# Patient Record
Sex: Female | Born: 1980 | Race: White | Hispanic: No | Marital: Married | Smoking: Never smoker
Health system: Southern US, Community
[De-identification: ages and names within clinical notes are randomized; demographics above are authoritative.]

## PROBLEM LIST (undated history)

## (undated) DIAGNOSIS — K859 Acute pancreatitis without necrosis or infection, unspecified: Secondary | ICD-10-CM

## (undated) DIAGNOSIS — R109 Unspecified abdominal pain: Secondary | ICD-10-CM

## (undated) DIAGNOSIS — E119 Type 2 diabetes mellitus without complications: Secondary | ICD-10-CM

## (undated) DIAGNOSIS — E1110 Type 2 diabetes mellitus with ketoacidosis without coma: Secondary | ICD-10-CM

## (undated) HISTORY — PX: WISDOM TOOTH EXTRACTION: SHX21

---

## 1999-06-26 ENCOUNTER — Emergency Department (HOSPITAL_COMMUNITY): Admission: EM | Admit: 1999-06-26 | Discharge: 1999-06-26 | Payer: Self-pay | Admitting: Emergency Medicine

## 1999-06-26 ENCOUNTER — Encounter: Payer: Self-pay | Admitting: Emergency Medicine

## 1999-12-24 ENCOUNTER — Other Ambulatory Visit: Admission: RE | Admit: 1999-12-24 | Discharge: 1999-12-24 | Payer: Self-pay | Admitting: Obstetrics and Gynecology

## 2000-04-18 ENCOUNTER — Encounter: Payer: Self-pay | Admitting: *Deleted

## 2000-04-18 ENCOUNTER — Ambulatory Visit (HOSPITAL_COMMUNITY): Admission: RE | Admit: 2000-04-18 | Discharge: 2000-04-18 | Payer: Self-pay | Admitting: *Deleted

## 2001-03-19 ENCOUNTER — Other Ambulatory Visit: Admission: RE | Admit: 2001-03-19 | Discharge: 2001-03-19 | Payer: Self-pay | Admitting: Obstetrics and Gynecology

## 2001-03-31 ENCOUNTER — Encounter: Admission: RE | Admit: 2001-03-31 | Discharge: 2001-06-29 | Payer: Self-pay | Admitting: Family Medicine

## 2003-01-05 ENCOUNTER — Other Ambulatory Visit: Admission: RE | Admit: 2003-01-05 | Discharge: 2003-01-05 | Payer: Self-pay | Admitting: Obstetrics and Gynecology

## 2004-01-24 ENCOUNTER — Other Ambulatory Visit: Admission: RE | Admit: 2004-01-24 | Discharge: 2004-01-24 | Payer: Self-pay | Admitting: Obstetrics and Gynecology

## 2005-01-24 ENCOUNTER — Other Ambulatory Visit: Admission: RE | Admit: 2005-01-24 | Discharge: 2005-01-24 | Payer: Self-pay | Admitting: Obstetrics and Gynecology

## 2005-06-30 ENCOUNTER — Emergency Department (HOSPITAL_COMMUNITY): Admission: EM | Admit: 2005-06-30 | Discharge: 2005-06-30 | Payer: Self-pay | Admitting: Emergency Medicine

## 2006-03-16 ENCOUNTER — Emergency Department (HOSPITAL_COMMUNITY): Admission: EM | Admit: 2006-03-16 | Discharge: 2006-03-16 | Payer: Self-pay | Admitting: Emergency Medicine

## 2006-08-13 ENCOUNTER — Ambulatory Visit (HOSPITAL_COMMUNITY): Admission: RE | Admit: 2006-08-13 | Discharge: 2006-08-13 | Payer: Self-pay | Admitting: Obstetrics and Gynecology

## 2007-03-20 ENCOUNTER — Encounter: Admission: RE | Admit: 2007-03-20 | Discharge: 2007-03-20 | Payer: Self-pay | Admitting: Obstetrics and Gynecology

## 2008-02-02 ENCOUNTER — Ambulatory Visit (HOSPITAL_COMMUNITY): Admission: AD | Admit: 2008-02-02 | Discharge: 2008-02-03 | Payer: Self-pay | Admitting: Gynecology

## 2008-02-03 ENCOUNTER — Encounter: Payer: Self-pay | Admitting: Gynecology

## 2008-02-03 ENCOUNTER — Ambulatory Visit (HOSPITAL_COMMUNITY): Admission: RE | Admit: 2008-02-03 | Discharge: 2008-02-03 | Payer: Self-pay | Admitting: Gynecology

## 2008-04-12 ENCOUNTER — Other Ambulatory Visit: Admission: RE | Admit: 2008-04-12 | Discharge: 2008-04-12 | Payer: Self-pay | Admitting: Gynecology

## 2008-06-02 ENCOUNTER — Ambulatory Visit: Payer: Self-pay | Admitting: Gynecology

## 2008-07-01 ENCOUNTER — Ambulatory Visit: Payer: Self-pay | Admitting: Gynecology

## 2008-07-10 ENCOUNTER — Ambulatory Visit: Payer: Self-pay | Admitting: Gynecology

## 2008-07-12 ENCOUNTER — Ambulatory Visit: Payer: Self-pay | Admitting: Gynecology

## 2008-07-29 ENCOUNTER — Ambulatory Visit: Payer: Self-pay | Admitting: Gynecology

## 2008-08-01 ENCOUNTER — Ambulatory Visit: Payer: Self-pay | Admitting: Gynecology

## 2008-08-09 ENCOUNTER — Ambulatory Visit: Payer: Self-pay | Admitting: Gynecology

## 2008-08-12 ENCOUNTER — Ambulatory Visit: Payer: Self-pay | Admitting: Gynecology

## 2008-08-29 ENCOUNTER — Ambulatory Visit: Payer: Self-pay | Admitting: Gynecology

## 2008-11-15 ENCOUNTER — Ambulatory Visit: Payer: Self-pay | Admitting: Gynecology

## 2008-12-06 ENCOUNTER — Ambulatory Visit: Payer: Self-pay | Admitting: Gynecology

## 2008-12-16 ENCOUNTER — Ambulatory Visit: Payer: Self-pay | Admitting: Gynecology

## 2008-12-19 ENCOUNTER — Ambulatory Visit: Payer: Self-pay | Admitting: Gynecology

## 2009-04-17 ENCOUNTER — Encounter: Payer: Self-pay | Admitting: Gynecology

## 2009-04-17 ENCOUNTER — Other Ambulatory Visit: Admission: RE | Admit: 2009-04-17 | Discharge: 2009-04-17 | Payer: Self-pay | Admitting: Gynecology

## 2009-04-17 ENCOUNTER — Ambulatory Visit: Payer: Self-pay | Admitting: Gynecology

## 2009-10-17 ENCOUNTER — Ambulatory Visit (HOSPITAL_COMMUNITY): Admission: RE | Admit: 2009-10-17 | Discharge: 2009-10-17 | Payer: Self-pay | Admitting: Obstetrics and Gynecology

## 2009-12-05 ENCOUNTER — Ambulatory Visit (HOSPITAL_COMMUNITY): Admission: RE | Admit: 2009-12-05 | Discharge: 2009-12-05 | Payer: Self-pay | Admitting: Obstetrics and Gynecology

## 2009-12-07 ENCOUNTER — Encounter: Admission: RE | Admit: 2009-12-07 | Discharge: 2009-12-07 | Payer: Self-pay | Admitting: Obstetrics and Gynecology

## 2010-01-02 ENCOUNTER — Ambulatory Visit (HOSPITAL_COMMUNITY): Admission: RE | Admit: 2010-01-02 | Discharge: 2010-01-02 | Payer: Self-pay | Admitting: Obstetrics and Gynecology

## 2010-01-23 ENCOUNTER — Ambulatory Visit (HOSPITAL_COMMUNITY): Admission: RE | Admit: 2010-01-23 | Discharge: 2010-01-23 | Payer: Self-pay | Admitting: Obstetrics and Gynecology

## 2010-01-29 ENCOUNTER — Ambulatory Visit (HOSPITAL_COMMUNITY): Admission: RE | Admit: 2010-01-29 | Discharge: 2010-01-29 | Payer: Self-pay | Admitting: Obstetrics and Gynecology

## 2010-02-05 ENCOUNTER — Ambulatory Visit (HOSPITAL_COMMUNITY): Admission: RE | Admit: 2010-02-05 | Discharge: 2010-02-05 | Payer: Self-pay | Admitting: Obstetrics and Gynecology

## 2010-02-12 ENCOUNTER — Ambulatory Visit (HOSPITAL_COMMUNITY): Admission: RE | Admit: 2010-02-12 | Discharge: 2010-02-12 | Payer: Self-pay | Admitting: Obstetrics and Gynecology

## 2010-02-21 ENCOUNTER — Ambulatory Visit (HOSPITAL_COMMUNITY): Admission: RE | Admit: 2010-02-21 | Discharge: 2010-02-21 | Payer: Self-pay | Admitting: Obstetrics and Gynecology

## 2010-02-28 ENCOUNTER — Ambulatory Visit (HOSPITAL_COMMUNITY): Admission: RE | Admit: 2010-02-28 | Discharge: 2010-02-28 | Payer: Self-pay | Admitting: Obstetrics and Gynecology

## 2010-03-07 ENCOUNTER — Ambulatory Visit (HOSPITAL_COMMUNITY): Admission: RE | Admit: 2010-03-07 | Discharge: 2010-03-07 | Payer: Self-pay | Admitting: Obstetrics and Gynecology

## 2010-03-08 ENCOUNTER — Encounter: Admission: RE | Admit: 2010-03-08 | Discharge: 2010-03-08 | Payer: Self-pay | Admitting: Obstetrics and Gynecology

## 2010-03-14 ENCOUNTER — Ambulatory Visit (HOSPITAL_COMMUNITY): Admission: RE | Admit: 2010-03-14 | Discharge: 2010-03-14 | Payer: Self-pay | Admitting: Obstetrics and Gynecology

## 2010-03-21 ENCOUNTER — Ambulatory Visit (HOSPITAL_COMMUNITY): Admission: RE | Admit: 2010-03-21 | Discharge: 2010-03-21 | Payer: Self-pay | Admitting: Obstetrics and Gynecology

## 2010-03-27 ENCOUNTER — Ambulatory Visit (HOSPITAL_COMMUNITY): Admission: RE | Admit: 2010-03-27 | Discharge: 2010-03-27 | Payer: Self-pay | Admitting: Obstetrics and Gynecology

## 2010-03-30 ENCOUNTER — Ambulatory Visit (HOSPITAL_COMMUNITY): Admission: RE | Admit: 2010-03-30 | Discharge: 2010-03-30 | Payer: Self-pay | Admitting: Obstetrics and Gynecology

## 2010-04-03 ENCOUNTER — Ambulatory Visit (HOSPITAL_COMMUNITY): Admission: RE | Admit: 2010-04-03 | Discharge: 2010-04-03 | Payer: Self-pay | Admitting: Obstetrics and Gynecology

## 2010-04-06 ENCOUNTER — Ambulatory Visit (HOSPITAL_COMMUNITY): Admission: RE | Admit: 2010-04-06 | Discharge: 2010-04-06 | Payer: Self-pay | Admitting: Obstetrics and Gynecology

## 2010-04-10 ENCOUNTER — Ambulatory Visit (HOSPITAL_COMMUNITY): Admission: RE | Admit: 2010-04-10 | Discharge: 2010-04-10 | Payer: Self-pay | Admitting: Obstetrics and Gynecology

## 2010-04-13 ENCOUNTER — Ambulatory Visit (HOSPITAL_COMMUNITY): Admission: RE | Admit: 2010-04-13 | Discharge: 2010-04-13 | Payer: Self-pay | Admitting: Obstetrics and Gynecology

## 2010-04-17 ENCOUNTER — Ambulatory Visit (HOSPITAL_COMMUNITY): Admission: RE | Admit: 2010-04-17 | Discharge: 2010-04-17 | Payer: Self-pay | Admitting: Obstetrics and Gynecology

## 2010-04-20 ENCOUNTER — Inpatient Hospital Stay (HOSPITAL_COMMUNITY): Admission: RE | Admit: 2010-04-20 | Discharge: 2010-04-23 | Payer: Self-pay | Admitting: Obstetrics and Gynecology

## 2010-04-20 ENCOUNTER — Encounter (INDEPENDENT_AMBULATORY_CARE_PROVIDER_SITE_OTHER): Payer: Self-pay | Admitting: Obstetrics and Gynecology

## 2010-09-19 IMAGING — US US OB LIMITED
1 series · 14 of 17 positions shown · non-contrast
Comparison: none

OBSTETRICAL ULTRASOUND:
 This ultrasound was performed in The [HOSPITAL], and the [facility] report will be stored to [REDACTED] PACS.  This report is also available in [HOSPITAL]?s accessANYware.

[Series 1: us ob limited · 14 of 17 slices shown]
[im 1/17]
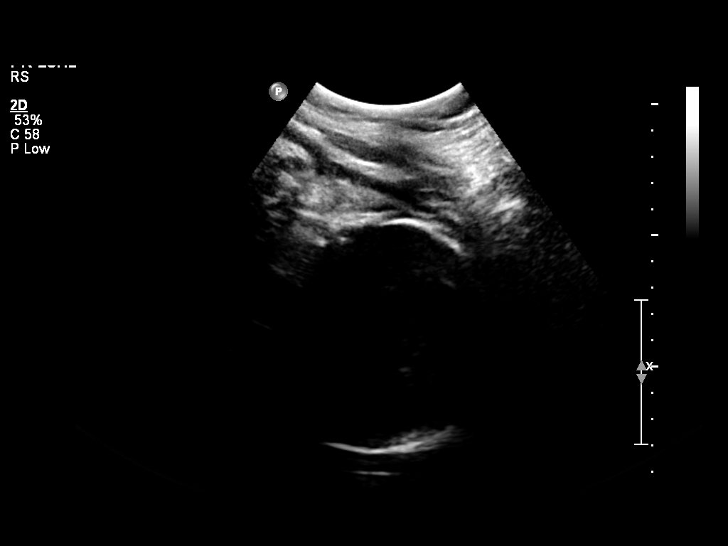
[im 2/17]
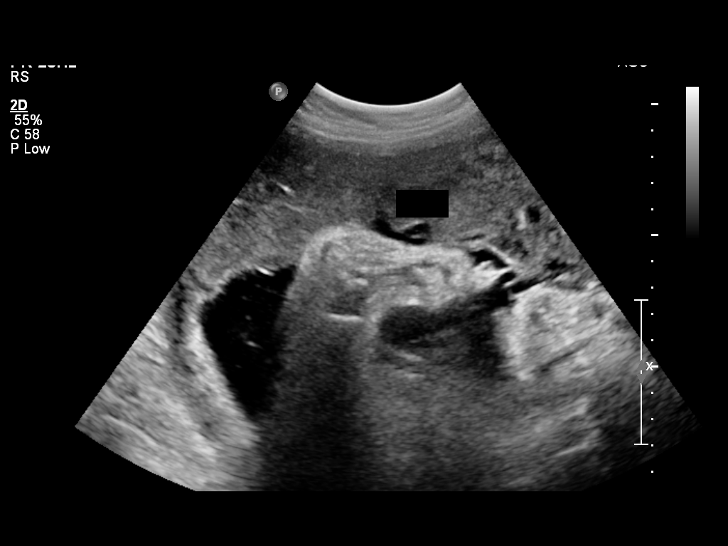
[im 4/17]
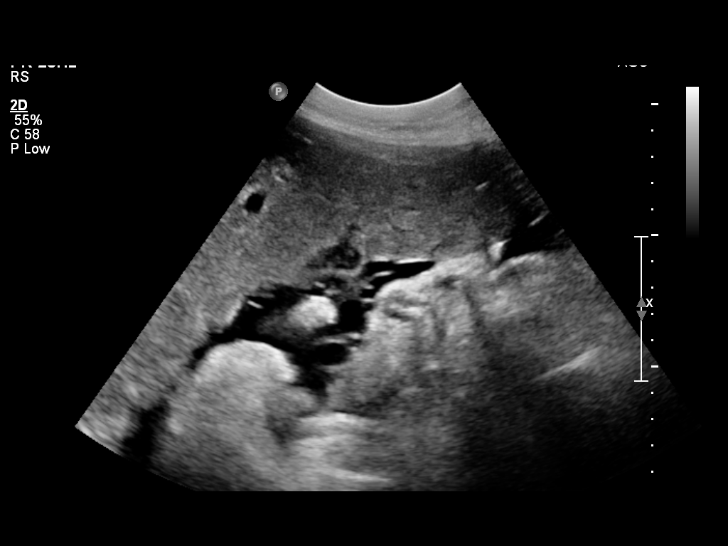
[im 5/17]
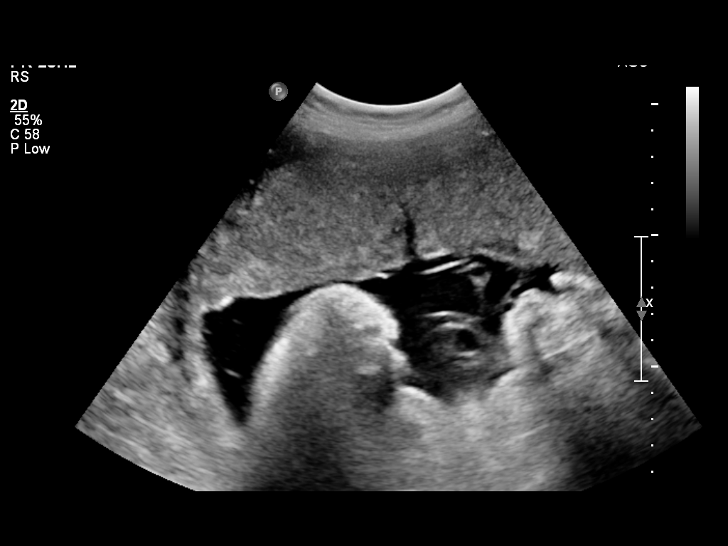
[im 6/17]
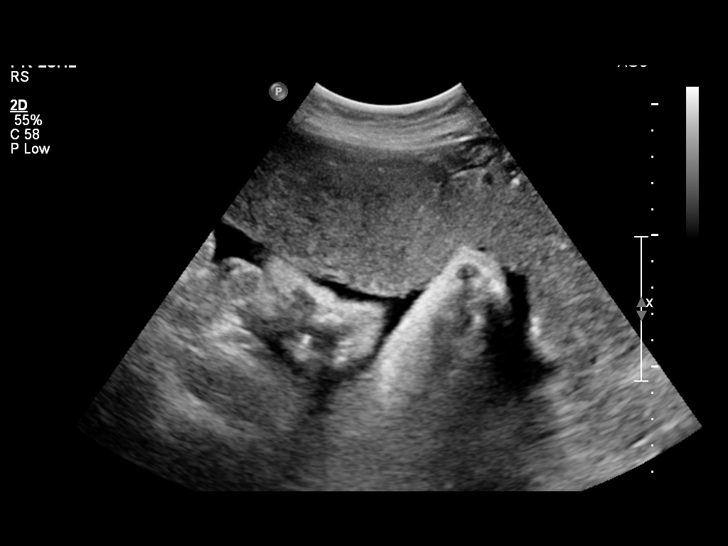
[im 7/17]
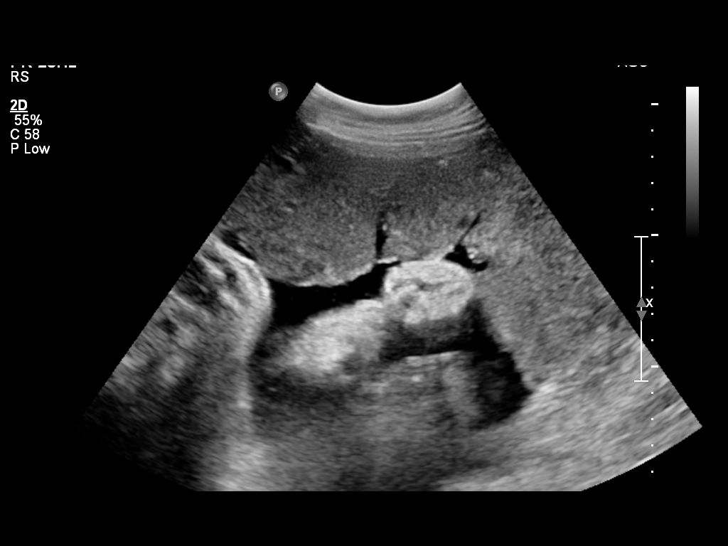
[im 8/17]
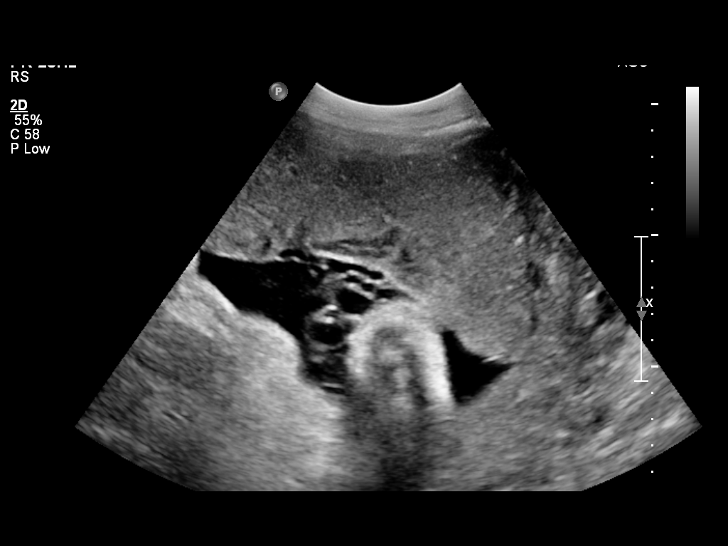
[im 10/17]
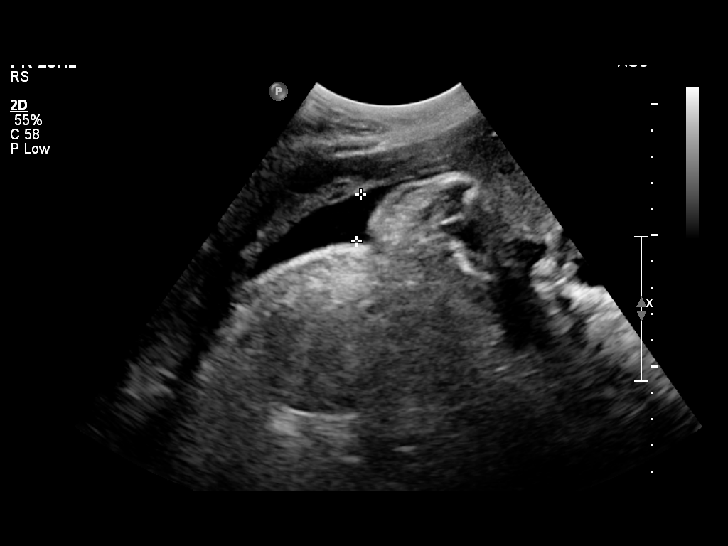
[im 11/17]
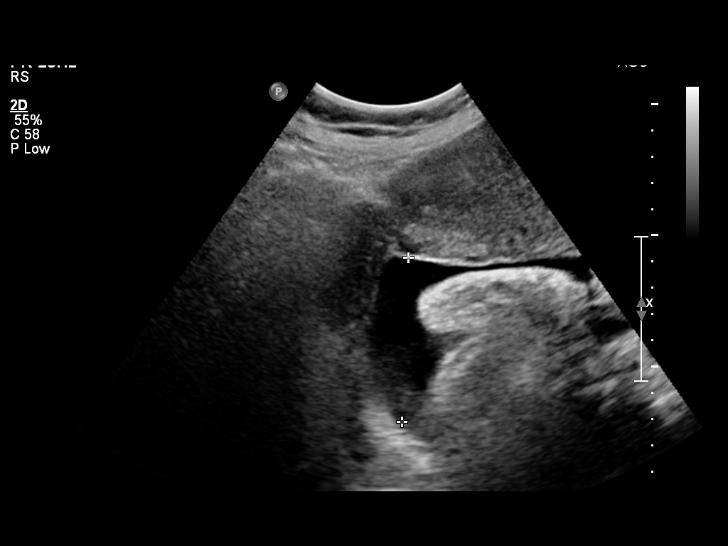
[im 12/17]
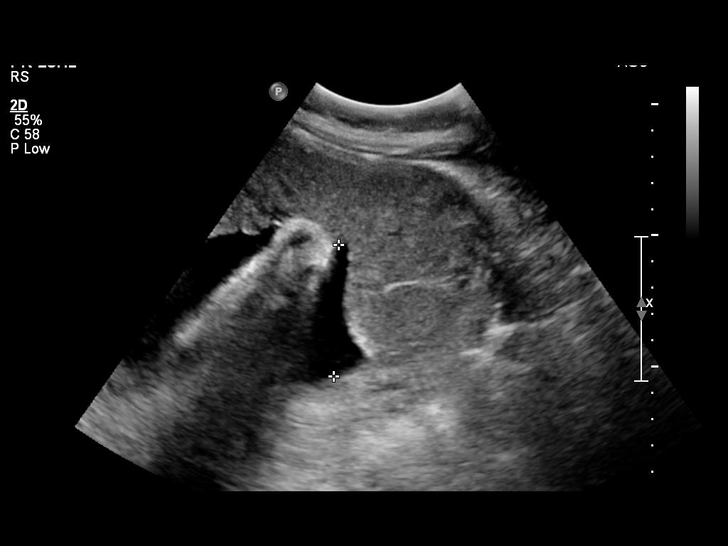
[im 13/17]
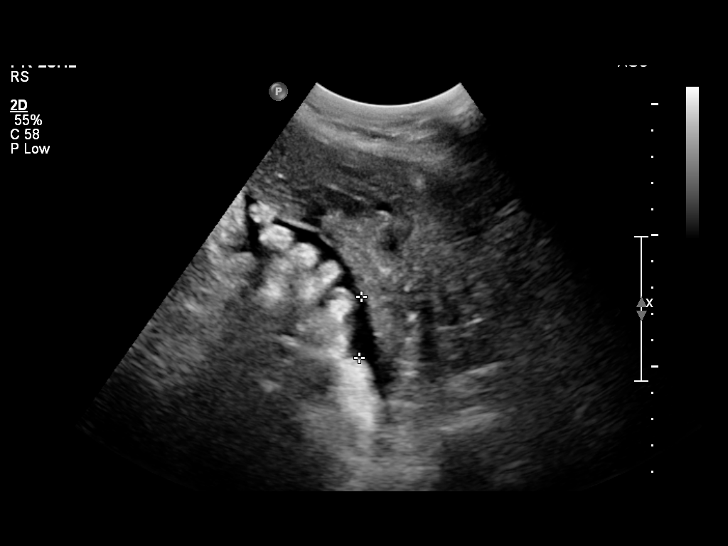
[im 14/17]
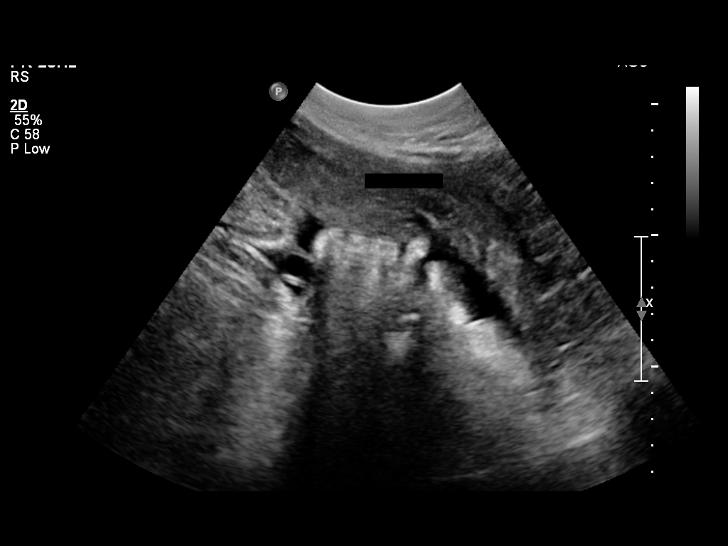
[im 16/17]
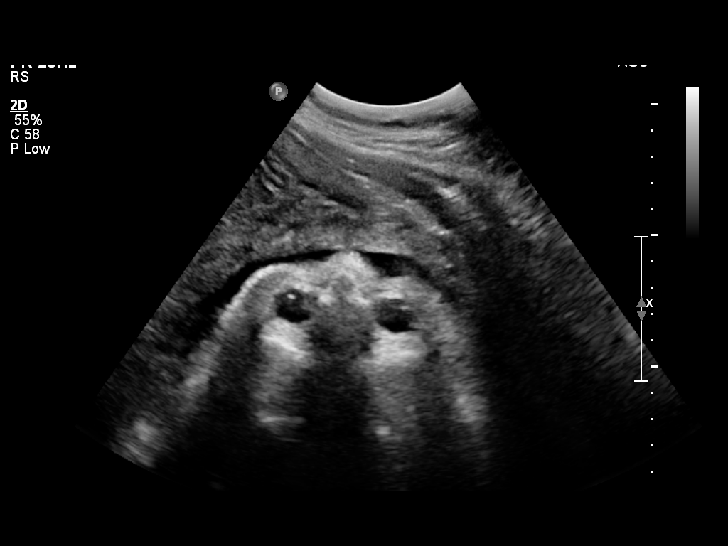
[im 17/17]
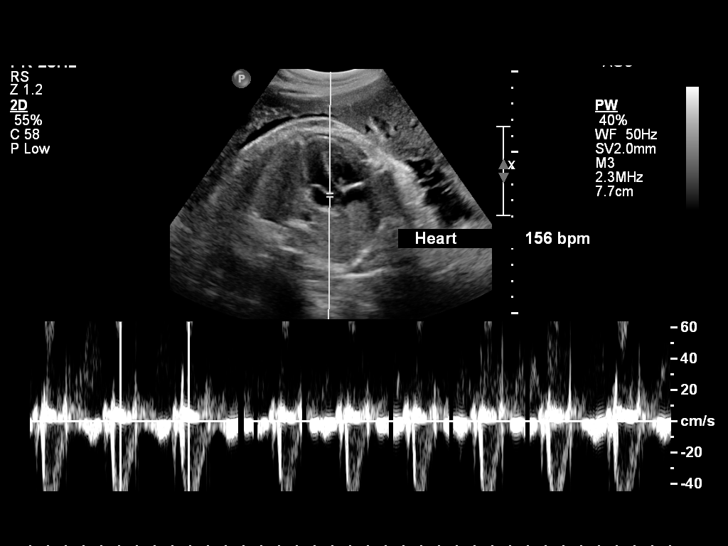

[14 of 17 positions shown; findings below may reference images not displayed]

IMPRESSION: [facility] has also been faxed to the ordering physician.

## 2010-10-14 ENCOUNTER — Encounter: Payer: Self-pay | Admitting: Obstetrics and Gynecology

## 2010-12-07 LAB — GLUCOSE, CAPILLARY: Glucose-Capillary: 115 mg/dL — ABNORMAL HIGH (ref 70–99)

## 2010-12-08 LAB — GLUCOSE, CAPILLARY
Glucose-Capillary: 106 mg/dL — ABNORMAL HIGH (ref 70–99)
Glucose-Capillary: 191 mg/dL — ABNORMAL HIGH (ref 70–99)
Glucose-Capillary: 64 mg/dL — ABNORMAL LOW (ref 70–99)
Glucose-Capillary: 72 mg/dL (ref 70–99)
Glucose-Capillary: 76 mg/dL (ref 70–99)
Glucose-Capillary: 84 mg/dL (ref 70–99)
Glucose-Capillary: 90 mg/dL (ref 70–99)
Glucose-Capillary: 97 mg/dL (ref 70–99)
Glucose-Capillary: 98 mg/dL (ref 70–99)

## 2010-12-08 LAB — CBC
HCT: 30 % — ABNORMAL LOW (ref 36.0–46.0)
Hemoglobin: 10 g/dL — ABNORMAL LOW (ref 12.0–15.0)
MCH: 25.9 pg — ABNORMAL LOW (ref 26.0–34.0)
MCH: 26.1 pg (ref 26.0–34.0)
MCV: 78.4 fL (ref 78.0–100.0)
RBC: 3.83 MIL/uL — ABNORMAL LOW (ref 3.87–5.11)
RDW: 17.9 % — ABNORMAL HIGH (ref 11.5–15.5)
RDW: 18.9 % — ABNORMAL HIGH (ref 11.5–15.5)
WBC: 7.8 10*3/uL (ref 4.0–10.5)
WBC: 7.9 10*3/uL (ref 4.0–10.5)

## 2010-12-08 LAB — COMPREHENSIVE METABOLIC PANEL
ALT: 17 U/L (ref 0–35)
Albumin: 2.5 g/dL — ABNORMAL LOW (ref 3.5–5.2)
BUN: 9 mg/dL (ref 6–23)
Calcium: 9.2 mg/dL (ref 8.4–10.5)
Creatinine, Ser: 0.76 mg/dL (ref 0.4–1.2)
GFR calc non Af Amer: >60 mL/min (ref >60)
Total Bilirubin: 0.4 mg/dL (ref 0.3–1.2)
Total Protein: 6.2 g/dL (ref 6.0–8.3)

## 2010-12-08 LAB — RPR: RPR Ser Ql: NONREACTIVE

## 2011-02-05 NOTE — Op Note (Signed)
Laurie Rubio, Laurie Rubio NO.:  [id number]   MEDICAL RECORD NO.:  [id number]          PATIENT TYPE:  MAT   LOCATION:  [facility]                          FACILITY:  [facility]   PHYSICIAN:  Juan H. Lily Peer, M.D.DATE OF BIRTH:  May 27, 1981   DATE OF PROCEDURE:  02/03/2008  DATE OF DISCHARGE:  02/03/2008                               OPERATIVE REPORT   INDICATIONS FOR OPERATION:  A 30 year old gravida 2, now AB 2,  patient  with type 2 diabetes with evidence of first trimester incomplete AB at  9.[redacted] weeks gestation.   PREOPERATIVE DIAGNOSES:  1. Type 2 diabetes on insulin.  2. First trimester incomplete abortion.  3. Recurrent pregnancy losses.   POSTOPERATIVE DIAGNOSES:  1. Type 2 diabetes on insulin.  2. First trimester incomplete abortion.  3. Recurrent pregnancy losses.   ANESTHESIA:  MAC along with a paracervical block consisting of 2%  lidocaine with 1:100000 epinephrine.   PROCEDURE PERFORMED:  Dilatation, evacuation.   FINDINGS:  8 to 10 weeks size uterus with tissue present at the cervical  os and blood and blood clots were present.   DESCRIPTION OF OPERATION:  After the patient was adequately counseled,  she was taken to the operating room she underwent successful intravenous  sedation.  She received 1 g of cefoxitin and was placed in low lithotomy  position.  Her vagina and perineum were prepped and draped in usual  sterile fashion.  Red rubber Roxan Hockey had been inserted into the bladder  to evacuate its contents for approximately 50 mL.  A speculum was  inserted into the vagina.  Tissue was found to be protruding from the  external os which was removed with the ring forceps.  An aliquot was  submitted for chromosomal studies and the rest of tissue along with  evacuation of the uterine contents was submitted for histological  evaluation.  The uterus had sounded to approximately 8.5 cm and required  minimal cervical dilatation with a Pratt dilator.  A 10-French  suction  curette was introduced into the intrauterine cavity to evacuate its  contents.  This was interchanged with a Hunter's curette to completely  evacuate the uterus of its contents.  Prior to this, 2% lidocaine with  1:100,000 epinephrine was infiltrated into the cervical stroma at the 2,  4, 8, and 10 o'clock position for a total 10 mL.  The patient tolerated  procedure well.  Blood loss was less than 50 mL and both fluid  resuscitation consisted of 100 mL of lactated Ringer's.  Preoperatively,  her blood sugar was 128 and will be checking in the recovery room as  well and blood type is A+.     Juan H. Lily Peer, M.D.  Electronically Signed    JHF/MEDQ  D:  02/03/2008  T:  02/03/2008  Job:  [id number]

## 2013-06-30 ENCOUNTER — Other Ambulatory Visit: Payer: Self-pay | Admitting: Endocrinology

## 2013-06-30 DIAGNOSIS — E049 Nontoxic goiter, unspecified: Secondary | ICD-10-CM

## 2013-07-05 ENCOUNTER — Ambulatory Visit
Admission: RE | Admit: 2013-07-05 | Discharge: 2013-07-05 | Disposition: A | Discharge status: Home / Self Care | Payer: Managed Care, Other (non HMO) | Source: Ambulatory Visit | Attending: Endocrinology | Admitting: Endocrinology

## 2013-07-05 DIAGNOSIS — E049 Nontoxic goiter, unspecified: Secondary | ICD-10-CM

## 2013-07-08 ENCOUNTER — Other Ambulatory Visit: Payer: Self-pay | Admitting: Endocrinology

## 2013-07-08 DIAGNOSIS — E041 Nontoxic single thyroid nodule: Secondary | ICD-10-CM

## 2013-07-08 DIAGNOSIS — E042 Nontoxic multinodular goiter: Secondary | ICD-10-CM

## 2013-07-13 ENCOUNTER — Inpatient Hospital Stay: Admission: RE | Admit: 2013-07-13 | Payer: Managed Care, Other (non HMO) | Source: Ambulatory Visit

## 2013-07-13 ENCOUNTER — Inpatient Hospital Stay
Admission: RE | Admit: 2013-07-13 | Discharge: 2013-07-13 | Disposition: A | Discharge status: Home / Self Care | Payer: Managed Care, Other (non HMO) | Source: Ambulatory Visit | Attending: Endocrinology | Admitting: Endocrinology

## 2013-07-19 ENCOUNTER — Other Ambulatory Visit: Payer: Self-pay | Admitting: Radiology

## 2013-07-20 ENCOUNTER — Ambulatory Visit
Admission: RE | Admit: 2013-07-20 | Discharge: 2013-07-20 | Disposition: A | Discharge status: Home / Self Care | Payer: Managed Care, Other (non HMO) | Source: Ambulatory Visit | Attending: Endocrinology | Admitting: Endocrinology

## 2013-07-20 ENCOUNTER — Other Ambulatory Visit (HOSPITAL_COMMUNITY)
Admission: RE | Admit: 2013-07-20 | Discharge: 2013-07-20 | Disposition: A | Discharge status: Home / Self Care | Payer: Managed Care, Other (non HMO) | Source: Ambulatory Visit | Attending: Interventional Radiology | Admitting: Interventional Radiology

## 2013-07-20 DIAGNOSIS — E042 Nontoxic multinodular goiter: Secondary | ICD-10-CM

## 2013-07-20 DIAGNOSIS — E041 Nontoxic single thyroid nodule: Secondary | ICD-10-CM

## 2014-08-14 ENCOUNTER — Emergency Department (INDEPENDENT_AMBULATORY_CARE_PROVIDER_SITE_OTHER)
Admission: EM | Admit: 2014-08-14 | Discharge: 2014-08-14 | Disposition: A | Discharge status: Home / Self Care | Payer: Medicaid Other | Source: Home / Self Care | Attending: Family Medicine | Admitting: Family Medicine

## 2014-08-14 ENCOUNTER — Encounter (HOSPITAL_COMMUNITY): Payer: Self-pay | Admitting: Emergency Medicine

## 2014-08-14 DIAGNOSIS — R499 Unspecified voice and resonance disorder: Secondary | ICD-10-CM

## 2014-08-14 DIAGNOSIS — H02849 Edema of unspecified eye, unspecified eyelid: Secondary | ICD-10-CM

## 2014-08-14 DIAGNOSIS — H109 Unspecified conjunctivitis: Secondary | ICD-10-CM

## 2014-08-14 HISTORY — DX: Type 2 diabetes mellitus without complications: E11.9

## 2014-08-14 MED ORDER — PREDNISONE 20 MG PO TABS: 40.0000 mg | ORAL_TABLET | Freq: Every day | ORAL | Status: DC

## 2014-08-14 MED ORDER — PREDNISONE 20 MG PO TABS
ORAL_TABLET | ORAL | Status: AC
  Filled 2014-08-14: qty 3

## 2014-08-14 MED ORDER — POLYMYXIN B-TRIMETHOPRIM 10000-0.1 UNIT/ML-% OP SOLN: 2.0000 [drp] | OPHTHALMIC | Status: DC

## 2014-08-14 MED ORDER — PREDNISONE 20 MG PO TABS
60.0000 mg | ORAL_TABLET | Freq: Once | ORAL | Status: AC
  Administered 2014-08-14: 60 mg via ORAL

## 2014-08-14 NOTE — ED Notes (Signed)
C/o  Irritation, pain, and crusting of both eyes.  also states feels like I'm having mild swelling in my throat.  Son recently dx with pink eye.

## 2014-08-14 NOTE — Discharge Instructions (Signed)
Thank you for coming in today. Go right to the emergency room if you get worse.  Take prednisone  Check your blood sugar.    Bacterial Conjunctivitis Bacterial conjunctivitis, commonly called pink eye, is an inflammation of the clear membrane that covers the white part of the eye (conjunctiva). The inflammation can also happen on the underside of the eyelids. The blood vessels in the conjunctiva become inflamed, causing the eye to become red or pink. Bacterial conjunctivitis may spread easily from one eye to another and from person to person (contagious).  CAUSES  Bacterial conjunctivitis is caused by bacteria. The bacteria may come from your own skin, your upper respiratory tract, or from someone else with bacterial conjunctivitis. SYMPTOMS  The normally white color of the eye or the underside of the eyelid is usually pink or red. The pink eye is usually associated with irritation, tearing, and some sensitivity to light. Bacterial conjunctivitis is often associated with a thick, yellowish discharge from the eye. The discharge may turn into a crust on the eyelids overnight, which causes your eyelids to stick together. If a discharge is present, there may also be some blurred vision in the affected eye. DIAGNOSIS  Bacterial conjunctivitis is diagnosed by your caregiver through an eye exam and the symptoms that you report. Your caregiver looks for changes in the surface tissues of your eyes, which may point to the specific type of conjunctivitis. A sample of any discharge may be collected on a cotton-tip swab if you have a severe case of conjunctivitis, if your cornea is affected, or if you keep getting repeat infections that do not respond to treatment. The sample will be sent to a lab to see if the inflammation is caused by a bacterial infection and to see if the infection will respond to antibiotic medicines. TREATMENT   Bacterial conjunctivitis is treated with antibiotics. Antibiotic eyedrops are  most often used. However, antibiotic ointments are also available. Antibiotics pills are sometimes used. Artificial tears or eye washes may ease discomfort. HOME CARE INSTRUCTIONS   To ease discomfort, apply a cool, clean washcloth to your eye for 10-20 minutes, 3-4 times a day.  Gently wipe away any drainage from your eye with a warm, wet washcloth or a cotton ball.  Wash your hands often with soap and water. Use paper towels to dry your hands.  Do not share towels or washcloths. This may spread the infection.  Change or wash your pillowcase every day.  You should not use eye makeup until the infection is gone.  Do not operate machinery or drive if your vision is blurred.  Stop using contact lenses. Ask your caregiver how to sterilize or replace your contacts before using them again. This depends on the type of contact lenses that you use.  When applying medicine to the infected eye, do not touch the edge of your eyelid with the eyedrop bottle or ointment tube. SEEK IMMEDIATE MEDICAL CARE IF:   Your infection has not improved within 3 days after beginning treatment.  You had yellow discharge from your eye and it returns.  You have increased eye pain.  Your eye redness is spreading.  Your vision becomes blurred.  You have a fever or persistent symptoms for more than 2-3 days.  You have a fever and your symptoms suddenly get worse.  You have facial pain, redness, or swelling. MAKE SURE YOU:   Understand these instructions.  Will watch your condition.  Will get help right away if you are  not doing well or get worse. Document Released: 09/09/2005 Document Revised: 01/24/2014 Document Reviewed: 02/10/2012 [facility] Patient Information 2015 [employer], [place]. This information is not intended to replace advice given to you by your health care provider. Make sure you discuss any questions you have with your health care provider.

## 2014-08-14 NOTE — ED Provider Notes (Signed)
Laurie Rubio is a 33 y.o. female who presents to Urgent Care today for eye swelling and pain with voice change. Symptoms started yesterday. Child with conjunctivae is present in the house. Patient notes bilateral conjunctivitis and irritation associated with eyelid swelling and facial rash. She denies any new soaps detergents shampoos cosmetics etc. No new medications. She has not tried anything. Her eyes were matted shut this morning. Additionally she notes a voice change. She denies any trouble swallowing or breathing. She feels well otherwise.   Past Medical History  Diagnosis Date  . Diabetes mellitus without complication    History reviewed. No pertinent past surgical history. History  Substance Use Topics  . Smoking status: Never Smoker   . Smokeless tobacco: Not on file  . Alcohol Use: No   ROS as above Medications: No current facility-administered medications for this encounter.   Current Outpatient Prescriptions  Medication Sig Dispense Refill  . insulin glargine (LANTUS) 100 UNIT/ML injection Inject into the skin at bedtime.    . Insulin Lispro, Human, (HUMALOG [place]) Inject into the skin.    . metFORMIN (GLUCOPHAGE) 500 MG tablet Take by mouth 2 (two) times daily with a meal.    . predniSONE (DELTASONE) 20 MG tablet Take 2 tablets (40 mg total) by mouth daily. 10 tablet 0  . trimethoprim-polymyxin b (POLYTRIM) ophthalmic solution Place 2 drops into both eyes every 4 (four) hours. 10 mL 1   No Known Allergies   Exam:  BP 125/75 mmHg  Pulse 112  Temp(Src) 99.7 F (37.6 C) (Oral)  Resp 16  SpO2 96%  LMP 08/14/2014 Gen: Well NAD HEENT: EOMI,  MMM bilateral conjunctival injection with eyelid swelling and mild erythematous milar macular rash. Eyes are nontender. Pain-free range of motion of the eyes. PERRL. normal-appearing posterior pharynx. No tongue swelling. Lungs: Normal work of breathing. CTABL Heart: Mildly tachycardic but regular no MRG Abd: NABS, Soft.  Nondistended, Nontender Exts: Brisk capillary refill, warm and well perfused.   No results found for this or any previous visit (from the past 24 hour(s)). No results found.  Assessment and Plan: 33 y.o. female with bilateral conjunctivitis. Patient additionally has voice change. Plan to treat conjunctivitis with Polytrim eyedrops. Additionally will use prednisone for possible pharynx inflammation. She appears well. Discussed return precautions. Patient voices understanding and agreement. Recommend patient check her blood sugar carefully while taking prednisone with diabetes.  Discussed warning signs or symptoms. Please see discharge instructions. Patient expresses understanding.     Rodolph BongEvan S Prabhnoor Ellenberger, MD 08/14/14 [id number]

## 2017-09-23 DIAGNOSIS — E1110 Type 2 diabetes mellitus with ketoacidosis without coma: Secondary | ICD-10-CM

## 2017-09-23 DIAGNOSIS — R109 Unspecified abdominal pain: Secondary | ICD-10-CM

## 2017-09-23 HISTORY — DX: Type 2 diabetes mellitus with ketoacidosis without coma: E11.10

## 2017-09-23 HISTORY — DX: Unspecified abdominal pain: R10.9

## 2017-10-06 ENCOUNTER — Encounter (HOSPITAL_COMMUNITY): Payer: Self-pay

## 2017-10-06 ENCOUNTER — Other Ambulatory Visit: Payer: Self-pay

## 2017-10-06 ENCOUNTER — Inpatient Hospital Stay (HOSPITAL_COMMUNITY)
Admission: EM | Admit: 2017-10-06 | Discharge: 2017-10-08 | DRG: 639 | Disposition: A | Discharge status: Home / Self Care | Payer: Managed Care, Other (non HMO) | Attending: Family Medicine | Admitting: Family Medicine

## 2017-10-06 ENCOUNTER — Emergency Department (HOSPITAL_COMMUNITY): Payer: Managed Care, Other (non HMO)

## 2017-10-06 DIAGNOSIS — E1110 Type 2 diabetes mellitus with ketoacidosis without coma: Principal | ICD-10-CM

## 2017-10-06 DIAGNOSIS — Z794 Long term (current) use of insulin: Secondary | ICD-10-CM

## 2017-10-06 DIAGNOSIS — R1013 Epigastric pain: Secondary | ICD-10-CM | POA: Diagnosis present

## 2017-10-06 DIAGNOSIS — M545 Low back pain: Secondary | ICD-10-CM | POA: Diagnosis present

## 2017-10-06 DIAGNOSIS — Z23 Encounter for immunization: Secondary | ICD-10-CM

## 2017-10-06 DIAGNOSIS — Z7984 Long term (current) use of oral hypoglycemic drugs: Secondary | ICD-10-CM

## 2017-10-06 DIAGNOSIS — E1165 Type 2 diabetes mellitus with hyperglycemia: Secondary | ICD-10-CM

## 2017-10-06 DIAGNOSIS — IMO0002 Reserved for concepts with insufficient information to code with codable children: Secondary | ICD-10-CM | POA: Diagnosis present

## 2017-10-06 DIAGNOSIS — E118 Type 2 diabetes mellitus with unspecified complications: Secondary | ICD-10-CM | POA: Diagnosis present

## 2017-10-06 DIAGNOSIS — E1151 Type 2 diabetes mellitus with diabetic peripheral angiopathy without gangrene: Secondary | ICD-10-CM | POA: Diagnosis present

## 2017-10-06 DIAGNOSIS — R109 Unspecified abdominal pain: Secondary | ICD-10-CM | POA: Diagnosis present

## 2017-10-06 DIAGNOSIS — T383X6A Underdosing of insulin and oral hypoglycemic [antidiabetic] drugs, initial encounter: Secondary | ICD-10-CM | POA: Diagnosis present

## 2017-10-06 DIAGNOSIS — E1310 Other specified diabetes mellitus with ketoacidosis without coma: Secondary | ICD-10-CM | POA: Diagnosis not present

## 2017-10-06 DIAGNOSIS — R638 Other symptoms and signs concerning food and fluid intake: Secondary | ICD-10-CM | POA: Diagnosis present

## 2017-10-06 HISTORY — DX: Type 2 diabetes mellitus with ketoacidosis without coma: E11.10

## 2017-10-06 HISTORY — DX: Unspecified abdominal pain: R10.9

## 2017-10-06 LAB — URINALYSIS, ROUTINE W REFLEX MICROSCOPIC
BACTERIA UA: NONE SEEN
Bilirubin Urine: NEGATIVE
Glucose, UA: >=500 mg/dL — AB
KETONES UR: 80 mg/dL — AB
Leukocytes, UA: NEGATIVE
Nitrite: NEGATIVE
PROTEIN: 100 mg/dL — AB
Specific Gravity, Urine: >1.046 — ABNORMAL HIGH (ref 1.005–1.030)
pH: 5 (ref 5.0–8.0)

## 2017-10-06 LAB — COMPREHENSIVE METABOLIC PANEL
ALBUMIN: 3.5 g/dL (ref 3.5–5.0)
ALK PHOS: 69 U/L (ref 38–126)
ALT: 16 U/L (ref 14–54)
ANION GAP: 17 — AB (ref 5–15)
AST: 27 U/L (ref 15–41)
BUN: 14 mg/dL (ref 6–20)
CHLORIDE: 94 mmol/L — AB (ref 101–111)
CO2: 15 mmol/L — AB (ref 22–32)
CREATININE: 0.82 mg/dL (ref 0.44–1.00)
Calcium: 9 mg/dL (ref 8.9–10.3)
GFR calc Af Amer: >60 mL/min (ref >60)
GFR calc non Af Amer: >60 mL/min (ref >60)
Glucose, Bld: 359 mg/dL — ABNORMAL HIGH (ref 65–99)
Potassium: 3.9 mmol/L (ref 3.5–5.1)
Sodium: 126 mmol/L — ABNORMAL LOW (ref 135–145)
Total Bilirubin: 2.1 mg/dL — ABNORMAL HIGH (ref 0.3–1.2)
Total Protein: 7.7 g/dL (ref 6.5–8.1)

## 2017-10-06 LAB — CBC
HEMATOCRIT: 42.7 % (ref 36.0–46.0)
HEMOGLOBIN: 14.9 g/dL (ref 12.0–15.0)
MCH: 27.5 pg (ref 26.0–34.0)
MCHC: 34.9 g/dL (ref 30.0–36.0)
MCV: 78.9 fL (ref 78.0–100.0)
Platelets: 294 10*3/uL (ref 150–400)
RBC: 5.41 MIL/uL — ABNORMAL HIGH (ref 3.87–5.11)
RDW: 13.5 % (ref 11.5–15.5)
WBC: 16.8 10*3/uL — ABNORMAL HIGH (ref 4.0–10.5)

## 2017-10-06 LAB — I-STAT BETA HCG BLOOD, ED (MC, WL, AP ONLY): I-stat hCG, quantitative: <5 m[IU]/mL (ref <5)

## 2017-10-06 LAB — I-STAT VENOUS BLOOD GAS, ED
ACID-BASE DEFICIT: 10 mmol/L — AB (ref 0.0–2.0)
BICARBONATE: 14.8 mmol/L — AB (ref 20.0–28.0)
O2 SAT: 92 %
TCO2: 16 mmol/L — AB (ref 22–32)
pCO2, Ven: 29.7 mmHg — ABNORMAL LOW (ref 44.0–60.0)
pH, Ven: 7.307 (ref 7.250–7.430)
pO2, Ven: 69 mmHg — ABNORMAL HIGH (ref 32.0–45.0)

## 2017-10-06 LAB — LIPASE, BLOOD: Lipase: 63 U/L — ABNORMAL HIGH (ref 11–51)

## 2017-10-06 LAB — CBG MONITORING, ED: Glucose-Capillary: 361 mg/dL — ABNORMAL HIGH (ref 65–99)

## 2017-10-06 MED ORDER — MORPHINE SULFATE (PF) 4 MG/ML IV SOLN: 4.0000 mg | Freq: Once | INTRAVENOUS | Status: DC

## 2017-10-06 MED ORDER — SODIUM CHLORIDE 0.9 % IV BOLUS (SEPSIS)
1000.0000 mL | Freq: Once | INTRAVENOUS | Status: AC
  Administered 2017-10-06: 1000 mL via INTRAVENOUS

## 2017-10-06 MED ORDER — PIPERACILLIN-TAZOBACTAM 3.375 G IVPB 30 MIN
3.3750 g | Freq: Once | INTRAVENOUS | Status: AC
  Administered 2017-10-06: 3.375 g via INTRAVENOUS
  Filled 2017-10-06: qty 50

## 2017-10-06 MED ORDER — SODIUM CHLORIDE 0.9 % IV SOLN
INTRAVENOUS | Status: DC
  Administered 2017-10-06: 3 [IU]/h via INTRAVENOUS
  Filled 2017-10-06: qty 1

## 2017-10-06 MED ORDER — POTASSIUM CHLORIDE 10 MEQ/50ML IV SOLN: 10.0000 meq | INTRAVENOUS | Status: DC | PRN

## 2017-10-06 MED ORDER — DEXTROSE-NACL 5-0.45 % IV SOLN: INTRAVENOUS | Status: DC

## 2017-10-06 MED ORDER — SODIUM CHLORIDE 0.9 % IV SOLN
INTRAVENOUS | Status: DC
  Administered 2017-10-07: 01:00:00 via INTRAVENOUS

## 2017-10-06 MED ORDER — HYDROMORPHONE HCL 1 MG/ML IJ SOLN
1.0000 mg | Freq: Once | INTRAMUSCULAR | Status: AC
  Administered 2017-10-06: 1 mg via INTRAVENOUS
  Filled 2017-10-06: qty 1

## 2017-10-06 MED ORDER — IOPAMIDOL (ISOVUE-300) INJECTION 61%
INTRAVENOUS | Status: AC
  Administered 2017-10-06: 100 mL
  Filled 2017-10-06: qty 100

## 2017-10-06 NOTE — ED Provider Notes (Signed)
Patient placed in Quick Look pathway, seen and evaluated   Chief Complaint: Abdominal pain  HPI:   37 year old female presents today with complaints of right-sided abdominal pain.  Patient reports 2 days ago she started to develop right upper and lower quadrant abdominal pain that has persisted.  Patient notes nausea no vomiting, reports normal bowel movements, denies any fever at home.  Patient reports a C-section approximately 7 years ago otherwise no surgical history.  She notes that her urine has been frequent, but questions if this is secondary to uncontrolled diabetes.  Patient was seen by primary care and referred over to the emergency room, no previous labs ordered.  ROS: Positive for abdominal pain, positive for nausea, negative for vomiting, negative for fever, positive for urinary frequency (one)  Physical Exam:   Gen: No distress  Neuro: Awake and Alert  Skin: Warm    Focused Exam: Exquisite tenderness to both right upper and right lower abdomen, no guarding, no masses  Vitals:   10/06/17 1808  BP: 128/81  Pulse: (!) 127  Resp: 16  Temp: 99.4 F (37.4 C)  SpO2: 97%     Initiation of care has begun. The patient has been counseled on the process, plan, and necessity for staying for the completion/evaluation, and the remainder of the medical screening examination    Eyvonne MechanicHedges, Jaia Alonge, Cordelia Poche-C 10/06/17 1821    Jacalyn LefevreHaviland, Julie, MD 10/06/17 [phone]

## 2017-10-06 NOTE — ED Triage Notes (Signed)
Pt states pain on the right side of the abdomen X2 days. Some pain radiating to the back. Pt states she has felt nauseous but no vomiting. Skin warm and dry. No acute distress.

## 2017-10-06 NOTE — ED Provider Notes (Signed)
[facility] EMERGENCY DEPARTMENT Provider Note   CSN: [id number] Arrival date & time: 10/06/17  1729     History   Chief Complaint Chief Complaint  Patient presents with  . Abdominal Pain    HPI Laurie Rubio is a 37 y.o. female with a h/o of IDDM Type II who presents to the emergency department with a chief complaint of abdominal pain with associated nausea that began 3 days ago.   She reports constant epigastric pain when the pain began that moved to her right-side today. She states the pain is non-radiating, constant, severe, and characterized as pressure. She states the pain was significantly worse on the car ride to the ED with all of the bouncing from the ride. No other aggravating or alleviating factors.  She denies fever, chills, emesis, hematemesis, dysuria, frequency, hematuria, vaginal pain or discharge, diarrhea, constipation, melena, or hematochezia. LMP was earlier today. LMP 09/16/17.   She also complains of constant, mid-line low back pain that began 3 days ago.  No known trauma or injury.  No aggravating or alleviating factors.  No history of similar. She also endorses intermittent blurred vision over the last month and intermittent muscle spasms, "Charley horses" in her bilateral lower legs and feet over the last 3 days.  She reports that she was seen earlier today by her PCP who advised her to come to the emergency department for further workup.  She has been out of her home metformin and insulin for the last month.  She just reorder the medications yesterday.  The history is provided by the patient. No language interpreter was used.    Past Medical History:  Diagnosis Date  . Diabetes mellitus without complication [facility])     Patient Active Problem List   Diagnosis Date Noted  . DKA, type 2 (HCC) 10/07/2017  . Abdominal pain 10/07/2017  . Diabetic ketoacidosis without coma associated with type 2 diabetes mellitus (HCC)     History  reviewed. No pertinent surgical history.  OB History    No data available       Home Medications    Prior to Admission medications   Medication Sig Start Date End Date Taking? Authorizing Provider  metFORMIN (GLUCOPHAGE) 500 MG tablet Take 1,000 mg by mouth 2 (two) times daily with a meal.    Yes [provider]  predniSONE (DELTASONE) 20 MG tablet Take 2 tablets (40 mg total) by mouth daily. Patient not taking: Reported on 10/06/2017 08/14/14   Rodolph Bong, MD  trimethoprim-polymyxin b Joaquim Lai) ophthalmic solution Place 2 drops into both eyes every 4 (four) hours. Patient not taking: Reported on 10/06/2017 08/14/14   Rodolph Bong, MD    Family History Family History  Problem Relation Age of Onset  . Hypertension Other     Social History Social History   Tobacco Use  . Smoking status: Never Smoker  . Smokeless tobacco: Never Used  Substance Use Topics  . Alcohol use: No  . Drug use: Not on file     Allergies   Patient has no known allergies.   Review of Systems Review of Systems  Constitutional: Negative for chills and fever.  HENT: Negative for congestion.   Eyes: Positive for visual disturbance (blurred).  Respiratory: Negative for shortness of breath.   Cardiovascular: Negative for chest pain.  Gastrointestinal: Positive for abdominal pain and nausea. Negative for constipation, diarrhea and vomiting.  Genitourinary: Negative for dysuria, frequency, hematuria, vaginal discharge and vaginal pain.  Musculoskeletal: Positive for back pain. Negative for gait problem, neck pain and neck stiffness.  Skin: Negative for color change.  Allergic/Immunologic: Positive for immunocompromised state.  Neurological: Negative for dizziness, weakness and light-headedness.   Physical Exam Updated Vital Signs BP (!) 109/55   Pulse (!) 105   Temp 99.8 F (37.7 C) (Rectal)   Resp 16   Ht 5\' 6"  (1.676 m)   Wt 88.9 kg (196 lb)   LMP 09/16/2017 (Within Days)    SpO2 97%   BMI 31.64 kg/m   Physical Exam  Constitutional: She is oriented to person, place, and time. No distress.  Appears much older than stated age.  HENT:  Head: Normocephalic.  Eyes: Conjunctivae are normal.  Neck: Normal range of motion. Neck supple.  Cardiovascular: Normal rate, regular rhythm, normal heart sounds and intact distal pulses. Exam reveals no gallop and no friction rub.  No murmur heard. Pulmonary/Chest: Effort normal and breath sounds normal. No stridor. No respiratory distress. She has no wheezes. She has no rales. She exhibits no tenderness.  Abdominal: Soft. She exhibits no distension and no mass. There is generalized tenderness. There is guarding. There is no rigidity. No hernia.  She is exquisitely tender to palpation throughout the abdomen.  No rigidity.  Abdomen is soft, nondistended.  No CVA tenderness bilaterally.  Musculoskeletal: She exhibits tenderness. She exhibits no edema or deformity.  Tender to palpation over the spinous processes of the lumbar spine.  No overlying erythema, edema, warmth, rash, or ecchymosis.  No surrounding paraspinal muscle tenderness.  Cervical and thoracic spinous processes and bilateral paraspinal muscles are nontender to palpation  Neurological: She is alert and oriented to person, place, and time.  GCS 15.  Skin: Skin is warm. Capillary refill takes less than 2 seconds. No rash noted. She is not diaphoretic.  Psychiatric: Her behavior is normal.  Nursing note and vitals reviewed.  ED Treatments / Results  Labs (all labs ordered are listed, but only abnormal results are displayed) Labs Reviewed  LIPASE, BLOOD - Abnormal; Notable for the following components:      Result Value   Lipase 63 (*)    All other components within normal limits  COMPREHENSIVE METABOLIC PANEL - Abnormal; Notable for the following components:   Sodium 126 (*)    Chloride 94 (*)    CO2 15 (*)    Glucose, Bld 359 (*)    Total Bilirubin 2.1 (*)     Anion gap 17 (*)    All other components within normal limits  CBC - Abnormal; Notable for the following components:   WBC 16.8 (*)    RBC 5.41 (*)    All other components within normal limits  URINALYSIS, ROUTINE W REFLEX MICROSCOPIC - Abnormal; Notable for the following components:   Specific Gravity, Urine >1.046 (*)    Glucose, UA >=500 (*)    Hgb urine dipstick MODERATE (*)    Ketones, ur 80 (*)    Protein, ur 100 (*)    Squamous Epithelial / LPF 0-5 (*)    All other components within normal limits  HEPATIC FUNCTION PANEL - Abnormal; Notable for the following components:   Albumin 3.4 (*)    Total Bilirubin 2.0 (*)    Indirect Bilirubin 1.6 (*)    All other components within normal limits  I-STAT VENOUS BLOOD GAS, ED - Abnormal; Notable for the following components:   pCO2, Ven 29.7 (*)    pO2, Ven 69.0 (*)    Bicarbonate  14.8 (*)    TCO2 16 (*)    Acid-base deficit 10.0 (*)    All other components within normal limits  CBG MONITORING, ED - Abnormal; Notable for the following components:   Glucose-Capillary 361 (*)    All other components within normal limits  CBG MONITORING, ED - Abnormal; Notable for the following components:   Glucose-Capillary 281 (*)    All other components within normal limits  CBG MONITORING, ED - Abnormal; Notable for the following components:   Glucose-Capillary 292 (*)    All other components within normal limits  CBG MONITORING, ED - Abnormal; Notable for the following components:   Glucose-Capillary 246 (*)    All other components within normal limits  CULTURE, BLOOD (ROUTINE X 2)  CULTURE, BLOOD (ROUTINE X 2)  HIV ANTIBODY (ROUTINE TESTING)  BASIC METABOLIC PANEL  BASIC METABOLIC PANEL  BASIC METABOLIC PANEL  HEMOGLOBIN A1C  MAGNESIUM  RAPID URINE DRUG SCREEN, HOSP PERFORMED  HEPATIC FUNCTION PANEL  TROPONIN I  LACTIC ACID, PLASMA  I-STAT BETA HCG BLOOD, ED ([facility], [facility], [facility] ONLY)    EKG  EKG Interpretation None        Radiology Ct Abdomen Pelvis W Contrast  Result Date: 10/06/2017 CLINICAL DATA:  Nausea right-sided abdominal pain EXAM: CT ABDOMEN AND PELVIS WITH CONTRAST TECHNIQUE: Multidetector CT imaging of the abdomen and pelvis was performed using the standard protocol following bolus administration of intravenous contrast. CONTRAST:  ISOVUE-300 IOPAMIDOL (ISOVUE-300) INJECTION 61% COMPARISON:  None. FINDINGS: Lower chest: Lung bases demonstrate no acute consolidation or effusion. Normal heart size. Hepatobiliary: Hepatic steatosis. No calcified gallstones or biliary dilatation Pancreas: Fluid and edema inferior to the pancreatic head and uncinate process. No ductal dilatation. Homogeneous pancreas enhancement Spleen: Normal in size without focal abnormality. Adrenals/Urinary Tract: Adrenal glands are unremarkable. Kidneys are normal, without renal calculi, focal lesion, or hydronephrosis. Bladder is unremarkable. Stomach/Bowel: Stomach is nonenlarged. Focal wall thickening involving the third portion of duodenum. No extraluminal gas. No evidence for bowel obstruction. Normal appendix. No colon wall thickening Vascular/Lymphatic: Aortic atherosclerosis. No enlarged abdominal or pelvic lymph nodes. Reproductive: Uterus unremarkable. Probable arcuate configuration of the uterus. Small probable cysts within both ovaries. Other: Negative for free air. Small amount of free fluid in the right anterior pararenal space. Musculoskeletal: No acute or significant osseous findings. IMPRESSION: 1. Moderate edema and fluid inferior to the pancreatic head and uncinate process and adjacent to the third portion of duodenum. Findings could be secondary to acute pancreatitis versus duodenitis. Third portion of duodenum is also thickened. There is no extraluminal gas in this region to suggest perforation. 2. Hepatic steatosis Electronically Signed   By: Jasmine Pang M.D.   On: 10/06/2017 22:08    Procedures Procedures  (including critical care time)  Medications Ordered in ED Medications  piperacillin-tazobactam (ZOSYN) IVPB 3.375 g (not administered)  0.9 %  sodium chloride infusion (not administered)  dextrose 5 %-0.45 % sodium chloride infusion (not administered)  insulin regular (NOVOLIN R,HUMULIN R) 100 Units in sodium chloride 0.9 % 100 mL (1 Units/mL) infusion (not administered)  HYDROmorphone (DILAUDID) injection 0.5 mg (not administered)  sodium chloride 0.9 % bolus 1,000 mL (0 mLs Intravenous Stopped 10/06/17 2313)  iopamidol (ISOVUE-300) 61 % injection (100 mLs  Contrast Given 10/06/17 2139)  sodium chloride 0.9 % bolus 1,000 mL (0 mLs Intravenous Stopped 10/07/17 0048)    And  sodium chloride 0.9 % bolus 1,000 mL (0 mLs Intravenous Stopped 10/07/17 0048)  HYDROmorphone (DILAUDID) injection 1  mg (1 mg Intravenous Given 10/06/17 2327)  piperacillin-tazobactam (ZOSYN) IVPB 3.375 g (0 g Intravenous Stopped 10/07/17 0015)     Initial Impression / Assessment and Plan / ED Course  I have reviewed the triage vital signs and the nursing notes.  Pertinent labs & imaging results that were available during my care of the patient were reviewed by me and considered in my medical decision making (see chart for details).     37 year old female with a h/o of IDDM Type II who has not taken her home medications for the last months. She presents with severe abdominal pain, nausea, low back pain, and blurred vision. Tachycardic in the 110s. Rectal temp. 99.8. Normotensive with no tachypnea.   Hyperglycemic to 361; anion gap 17; bicarb 15. Started on [employer]abil[employer] order set.  Corrected NA 131. PH 7.307 on VBG. Acid-base deficit 10. GCS 15. Normal neurological exam.  The patient was discussed with Dr. Clarice PolePfeifer, attending physician.  WBC 16.8. T. Bili 2.1, indirect bili 1.6. On physical exam,  pain was out of proportion to exam throughout the entire abdomen.  No rigidity to suggest peritonitis. CT with moderate edema  and fluid inferior to the pancreatic head and uncinate process and adjacent to the third portion of the duodenum concerning for acute pancreatitis versus duodenitis.  Third portion of the duodenum is also thickened.  No evidence of perforation. Lipase 63, minimally elevated, this could possibly be a early pancreatitis her lipase could be elevated secondary to DKA. but Zosyn was initiated in the ED given leukocytosis, CT findings, and physical exam.   Dr. Toniann FailKakrakandy with the hospitalist team will admit for DKA. The patient appears reasonably stabilized for admission considering the current resources, flow, and capabilities available in the ED at this time, and I doubt any other [facility] requiring further screening and/or treatment in the ED prior to admission.   Final Clinical Impressions(s) / ED Diagnoses   Final diagnoses:  Diabetic ketoacidosis without coma associated with type 2 diabetes mellitus [facility](HCC)    ED Discharge Orders    None       Barkley BoardsMcDonald, Axel Frisk A, PA-C 10/07/17 0245    Arby BarrettePfeiffer, Marcy, MD 10/16/17 1315

## 2017-10-07 ENCOUNTER — Other Ambulatory Visit: Payer: Self-pay

## 2017-10-07 ENCOUNTER — Inpatient Hospital Stay (HOSPITAL_COMMUNITY): Payer: Managed Care, Other (non HMO)

## 2017-10-07 ENCOUNTER — Encounter (HOSPITAL_COMMUNITY): Payer: Self-pay | Admitting: Internal Medicine

## 2017-10-07 DIAGNOSIS — IMO0002 Reserved for concepts with insufficient information to code with codable children: Secondary | ICD-10-CM | POA: Diagnosis present

## 2017-10-07 DIAGNOSIS — M545 Low back pain: Secondary | ICD-10-CM | POA: Diagnosis present

## 2017-10-07 DIAGNOSIS — R638 Other symptoms and signs concerning food and fluid intake: Secondary | ICD-10-CM | POA: Diagnosis present

## 2017-10-07 DIAGNOSIS — E1310 Other specified diabetes mellitus with ketoacidosis without coma: Secondary | ICD-10-CM | POA: Diagnosis present

## 2017-10-07 DIAGNOSIS — R1013 Epigastric pain: Secondary | ICD-10-CM

## 2017-10-07 DIAGNOSIS — Z794 Long term (current) use of insulin: Secondary | ICD-10-CM

## 2017-10-07 DIAGNOSIS — E1151 Type 2 diabetes mellitus with diabetic peripheral angiopathy without gangrene: Secondary | ICD-10-CM | POA: Diagnosis present

## 2017-10-07 DIAGNOSIS — E1110 Type 2 diabetes mellitus with ketoacidosis without coma: Secondary | ICD-10-CM | POA: Diagnosis not present

## 2017-10-07 DIAGNOSIS — E1111 Type 2 diabetes mellitus with ketoacidosis with coma: Secondary | ICD-10-CM

## 2017-10-07 DIAGNOSIS — Z7984 Long term (current) use of oral hypoglycemic drugs: Secondary | ICD-10-CM | POA: Diagnosis not present

## 2017-10-07 DIAGNOSIS — T383X6A Underdosing of insulin and oral hypoglycemic [antidiabetic] drugs, initial encounter: Secondary | ICD-10-CM | POA: Diagnosis present

## 2017-10-07 DIAGNOSIS — R109 Unspecified abdominal pain: Secondary | ICD-10-CM | POA: Diagnosis present

## 2017-10-07 DIAGNOSIS — E118 Type 2 diabetes mellitus with unspecified complications: Secondary | ICD-10-CM | POA: Diagnosis present

## 2017-10-07 DIAGNOSIS — E1165 Type 2 diabetes mellitus with hyperglycemia: Secondary | ICD-10-CM

## 2017-10-07 DIAGNOSIS — Z23 Encounter for immunization: Secondary | ICD-10-CM | POA: Diagnosis not present

## 2017-10-07 LAB — CBG MONITORING, ED
GLUCOSE-CAPILLARY: 238 mg/dL — AB (ref 65–99)
GLUCOSE-CAPILLARY: 246 mg/dL — AB (ref 65–99)
GLUCOSE-CAPILLARY: 281 mg/dL — AB (ref 65–99)
Glucose-Capillary: 292 mg/dL — ABNORMAL HIGH (ref 65–99)
Glucose-Capillary: 229 mg/dL — ABNORMAL HIGH (ref 65–99)
Glucose-Capillary: 205 mg/dL — ABNORMAL HIGH (ref 65–99)
Glucose-Capillary: 203 mg/dL — ABNORMAL HIGH (ref 65–99)
Glucose-Capillary: 182 mg/dL — ABNORMAL HIGH (ref 65–99)
Glucose-Capillary: 163 mg/dL — ABNORMAL HIGH (ref 65–99)
Glucose-Capillary: 118 mg/dL — ABNORMAL HIGH (ref 65–99)
Glucose-Capillary: 242 mg/dL — ABNORMAL HIGH (ref 65–99)

## 2017-10-07 LAB — BASIC METABOLIC PANEL
Anion gap: 11 (ref 5–15)
Anion gap: 8 (ref 5–15)
Anion gap: 8 (ref 5–15)
BUN: 12 mg/dL (ref 6–20)
BUN: 9 mg/dL (ref 6–20)
BUN: 10 mg/dL (ref 6–20)
CALCIUM: 8 mg/dL — AB (ref 8.9–10.3)
CALCIUM: 7.9 mg/dL — AB (ref 8.9–10.3)
CALCIUM: 8.3 mg/dL — AB (ref 8.9–10.3)
CHLORIDE: 106 mmol/L (ref 101–111)
CO2: 17 mmol/L — ABNORMAL LOW (ref 22–32)
CO2: 20 mmol/L — ABNORMAL LOW (ref 22–32)
CO2: 21 mmol/L — ABNORMAL LOW (ref 22–32)
CREATININE: 0.68 mg/dL (ref 0.44–1.00)
CREATININE: 0.44 mg/dL (ref 0.44–1.00)
CREATININE: 0.48 mg/dL (ref 0.44–1.00)
Chloride: 105 mmol/L (ref 101–111)
Chloride: 105 mmol/L (ref 101–111)
GFR calc non Af Amer: >60 mL/min (ref >60)
GFR calc non Af Amer: >60 mL/min (ref >60)
Glucose, Bld: 258 mg/dL — ABNORMAL HIGH (ref 65–99)
Glucose, Bld: 220 mg/dL — ABNORMAL HIGH (ref 65–99)
Glucose, Bld: 161 mg/dL — ABNORMAL HIGH (ref 65–99)
Potassium: 3.1 mmol/L — ABNORMAL LOW (ref 3.5–5.1)
Potassium: 3.1 mmol/L — ABNORMAL LOW (ref 3.5–5.1)
Potassium: 3.2 mmol/L — ABNORMAL LOW (ref 3.5–5.1)
SODIUM: 133 mmol/L — AB (ref 135–145)
SODIUM: 133 mmol/L — AB (ref 135–145)
SODIUM: 135 mmol/L (ref 135–145)

## 2017-10-07 LAB — HEMOGLOBIN A1C
HEMOGLOBIN A1C: 12.5 % — AB (ref 4.8–5.6)
MEAN PLASMA GLUCOSE: 312.05 mg/dL

## 2017-10-07 LAB — HEPATIC FUNCTION PANEL
ALBUMIN: 3.4 g/dL — AB (ref 3.5–5.0)
ALT: 20 U/L (ref 14–54)
ALT: 17 U/L (ref 14–54)
AST: 27 U/L (ref 15–41)
AST: 13 U/L — ABNORMAL LOW (ref 15–41)
Albumin: 2.7 g/dL — ABNORMAL LOW (ref 3.5–5.0)
Alkaline Phosphatase: 77 U/L (ref 38–126)
Alkaline Phosphatase: 51 U/L (ref 38–126)
BILIRUBIN DIRECT: 0.2 mg/dL (ref 0.1–0.5)
BILIRUBIN INDIRECT: 1.1 mg/dL — AB (ref 0.3–0.9)
Bilirubin, Direct: 0.4 mg/dL (ref 0.1–0.5)
Indirect Bilirubin: 1.6 mg/dL — ABNORMAL HIGH (ref 0.3–0.9)
TOTAL PROTEIN: 7.8 g/dL (ref 6.5–8.1)
Total Bilirubin: 2 mg/dL — ABNORMAL HIGH (ref 0.3–1.2)
Total Bilirubin: 1.3 mg/dL — ABNORMAL HIGH (ref 0.3–1.2)
Total Protein: 6.2 g/dL — ABNORMAL LOW (ref 6.5–8.1)

## 2017-10-07 LAB — RAPID URINE DRUG SCREEN, HOSP PERFORMED
Amphetamines: NOT DETECTED
BARBITURATES: NOT DETECTED
BENZODIAZEPINES: NOT DETECTED
COCAINE: NOT DETECTED
Opiates: NOT DETECTED
TETRAHYDROCANNABINOL: NOT DETECTED

## 2017-10-07 LAB — GLUCOSE, CAPILLARY
GLUCOSE-CAPILLARY: 322 mg/dL — AB (ref 65–99)
Glucose-Capillary: 296 mg/dL — ABNORMAL HIGH (ref 65–99)

## 2017-10-07 LAB — HIV ANTIBODY (ROUTINE TESTING W REFLEX): HIV SCREEN 4TH GENERATION: NONREACTIVE

## 2017-10-07 LAB — TRIGLYCERIDES: Triglycerides: 461 mg/dL — ABNORMAL HIGH (ref <150)

## 2017-10-07 LAB — MAGNESIUM: Magnesium: 1.8 mg/dL (ref 1.7–2.4)

## 2017-10-07 LAB — TROPONIN I: Troponin I: <0.03 ng/mL (ref <0.03)

## 2017-10-07 LAB — LACTIC ACID, PLASMA: LACTIC ACID, VENOUS: 0.7 mmol/L (ref 0.5–1.9)

## 2017-10-07 MED ORDER — INSULIN ASPART 100 UNIT/ML ~~LOC~~ SOLN
0.0000 [IU] | Freq: Three times a day (TID) | SUBCUTANEOUS | Status: DC
  Administered 2017-10-07: 7 [IU] via SUBCUTANEOUS
  Administered 2017-10-08 (×2): 5 [IU] via SUBCUTANEOUS

## 2017-10-07 MED ORDER — PANTOPRAZOLE SODIUM 40 MG IV SOLR
40.0000 mg | Freq: Two times a day (BID) | INTRAVENOUS | Status: DC
  Administered 2017-10-07 – 2017-10-08 (×3): 40 mg via INTRAVENOUS
  Filled 2017-10-07 (×3): qty 40

## 2017-10-07 MED ORDER — INSULIN ASPART 100 UNIT/ML ~~LOC~~ SOLN
0.0000 [IU] | SUBCUTANEOUS | Status: DC
  Filled 2017-10-07: qty 1

## 2017-10-07 MED ORDER — INSULIN ASPART 100 UNIT/ML ~~LOC~~ SOLN
7.0000 [IU] | Freq: Once | SUBCUTANEOUS | Status: AC
  Administered 2017-10-07: 7 [IU] via SUBCUTANEOUS

## 2017-10-07 MED ORDER — HYDROMORPHONE HCL 1 MG/ML IJ SOLN
0.5000 mg | INTRAMUSCULAR | Status: DC | PRN
  Administered 2017-10-07 – 2017-10-08 (×3): 0.5 mg via INTRAVENOUS
  Filled 2017-10-07: qty 1
  Filled 2017-10-07 (×2): qty 0.5

## 2017-10-07 MED ORDER — KCL IN DEXTROSE-NACL 20-5-0.45 MEQ/L-%-% IV SOLN
INTRAVENOUS | Status: DC
  Administered 2017-10-07: 08:00:00 via INTRAVENOUS
  Filled 2017-10-07: qty 1000

## 2017-10-07 MED ORDER — DEXTROSE-NACL 5-0.45 % IV SOLN
INTRAVENOUS | Status: DC
  Administered 2017-10-07: 03:00:00 via INTRAVENOUS

## 2017-10-07 MED ORDER — INSULIN ASPART 100 UNIT/ML ~~LOC~~ SOLN: 7.0000 [IU] | Freq: Once | SUBCUTANEOUS | Status: DC

## 2017-10-07 MED ORDER — PIPERACILLIN-TAZOBACTAM 3.375 G IVPB: 3.3750 g | Freq: Three times a day (TID) | INTRAVENOUS | Status: DC

## 2017-10-07 MED ORDER — SODIUM CHLORIDE 0.9 % IV SOLN
INTRAVENOUS | Status: DC
  Administered 2017-10-07: 04:00:00 via INTRAVENOUS

## 2017-10-07 MED ORDER — SODIUM CHLORIDE 0.9 % IV SOLN
INTRAVENOUS | Status: DC
  Filled 2017-10-07: qty 1

## 2017-10-07 MED ORDER — INFLUENZA VAC SPLIT QUAD 0.5 ML IM SUSY
0.5000 mL | PREFILLED_SYRINGE | INTRAMUSCULAR | Status: AC
  Administered 2017-10-08: 0.5 mL via INTRAMUSCULAR
  Filled 2017-10-07: qty 0.5

## 2017-10-07 MED ORDER — INSULIN ASPART 100 UNIT/ML ~~LOC~~ SOLN: 3.0000 [IU] | Freq: Three times a day (TID) | SUBCUTANEOUS | Status: DC

## 2017-10-07 MED ORDER — ONDANSETRON HCL 4 MG/2ML IJ SOLN
4.0000 mg | Freq: Four times a day (QID) | INTRAMUSCULAR | Status: DC | PRN
  Administered 2017-10-07: 4 mg via INTRAVENOUS
  Filled 2017-10-07: qty 2

## 2017-10-07 NOTE — ED Notes (Signed)
VO for 7 units novolog SQ at this time with insulin drip and to disregard  Additional orders for SQ insulin at this time per Dr. Mahala MenghiniSamtani. Read back and verified.

## 2017-10-07 NOTE — Progress Notes (Signed)
Inpatient Diabetes Program Recommendations  AACE/ADA: New Consensus Statement on Inpatient Glycemic Control (2015)  Target Ranges:  Prepandial:   less than 140 mg/dL      Peak postprandial:   less than 180 mg/dL (1-2 hours)      Critically ill patients:  140 - 180 mg/dL   Lab Results  Component Value Date   GLUCAP 242 (H) 10/07/2017   HGBA1C 12.5 (H) 10/07/2017   Spoke with patient about diabetes and home regimen for diabetes control. Patient reports that she is followed by Laurie Rubio for diabetes management. Patient Last saw Laurie. Talmage Rubio in September or October. Patient was newly placed on 50/50 insulin at that time and got a 3 month supply, Prior to that patient was taking Lantus 80 units (patient thinks once a day) and Novolog or Humalog 80-100 units tid with meals. Patient reports not always following a DM diet. Patient reports that she was busy at work and travels at times and just did not contact the office for refills. Within the last week patient notified pharmacy to get refill but there was a problem and they had to call Laurie Rubio's office. Patient in room calling Laurie Rubio's office today and will see if Laurie. Talmage Rubio wants to continue with the 50/50 insulin.  Patient does not consistently check her glucose.  Inquired about prior A1C and patient reports that she has has had elevated A1c's for awhile and has been working with Laurie. Talmage Rubio to get it down. Discussed A1C results (12.5% on 10/07/17). Discussed glucose and A1C goals. Discussed importance of checking CBGs and maintaining good CBG control to prevent long-term and short-term complications. Explained how hyperglycemia leads to damage within blood vessels which lead to the common complications seen with uncontrolled diabetes. Stressed to the patient the importance of improving glycemic control to prevent further complications from uncontrolled diabetes. Discussed impact of nutrition, exercise, stress, sickness, and medications on diabetes control.  Discussed carbohydrates, carbohydrate goals per day and meal, along with portion sizes. Encouraged patient to check glucose at  Least 2 times per day and to keep a log book of glucose readings and insulin taken which will need to be taken to doctor appointments. Explained how the doctor can use the log book to continue to make insulin adjustments if needed. Patient verbalized understanding of information discussed and has no further questions at this time related to diabetes.   Thanks,  Laurie DeemShannon Maureen Duesing RN, MSN, [facility] Inpatient Diabetes Coordinator Team Pager [phone] (8a-5p)

## 2017-10-07 NOTE — ED Notes (Signed)
Ordered pt a lunch tray 

## 2017-10-07 NOTE — Progress Notes (Signed)
Agree with history plan and assessment as above per Dr. Kirtland BouchardK  Noncompliant 37 year old female with type 2 diabetes mellitus admitted with abdominal pain--found to be in moderate to mild DKA start from insulin drip-further workup pending regarding whether pancreatitis or duodenitis given abdominal pain-lip[ase was 3163  She has been told in the past she has very ? TG and was on meds for this but stopped 7 yr agho after her baby She stopped taking insulin ~ 1 mo agoi as she got busy with woirk and didn't order it-she was on a 50/50 mix through Dr. Miguel DibbleBalan-was being evaluated for Insulin pump but as woujld cost 1000 $ did not oibtain  O/e alert pleasant in nad Some n No cp bno fever no chills abd soft and tender throughout no rebound no guard No le edema  A/p Probable pancreatitis --I have d/c the Zosyn empirically started DKA-resolving--ask DM coordinator to see Give 7 U Insulin reg now-await rpt bmet and if gap still closed d/c Gtt--if this is the case can go to MEd surg bed-replace IVF to contain K for 12 more hours--diabetic diet Patient updated   Laurie KochJai Renzo Vincelette, MD [facility] [phone](P) [phone]

## 2017-10-07 NOTE — ED Notes (Signed)
Admitting at bedside 

## 2017-10-07 NOTE — ED Notes (Signed)
VO received from Dr. Mahala MenghiniSamtani for 4mg  zofran IV q6hrs PRN

## 2017-10-07 NOTE — ED Notes (Signed)
CBG 242 

## 2017-10-07 NOTE — ED Notes (Signed)
PT reports headache and feeling "queezy". Requesting nausea medication. Breakfast tray at bedside and sprite zero provided

## 2017-10-07 NOTE — ED Notes (Signed)
Insulin verified with Sharrie Rothmanasey RN

## 2017-10-07 NOTE — ED Notes (Addendum)
Spoke with admitting MD, verified gap resulted at 8 and WNL.  Admitting MD will discontinue insulin drip.  Read back and [facility] with [facility].

## 2017-10-07 NOTE — Progress Notes (Signed)
Touch based with the patient regarding the endocrinologist call, pt said she called the office and left message, she is waiting for them to call back.  She was made aware that need to inform us as soon the office calls, verbalized understanding.

## 2017-10-07 NOTE — Progress Notes (Addendum)
Admission note:  Arrival Method: Patient arrived from [facility] in w/c accompanied by the staff. Mental Orientation:  Alert and oriented x 4. Telemetry: N/A Assessment: See the flow sheet docs. Skin: Warm and dry, no open areas or any rash noted. IV: Left wrist and R AC both SL. Pain: 8/10 in abdomen, given Dilaudid 0.5 mg. Tubes: N/A Safety Measures: Bed in low position, call bell and phone within reach. Fall Prevention Safety Plan: Reviewed the plan, verbalized understanding. Admission Screening: In progress 6700 Orientation: Patient has been oriented to the unit, staff and to the room.

## 2017-10-07 NOTE — H&P (Signed)
History and Physical    Laurie Rubio Abernathy ONG:[id number] DOB: 29-Dec-1980 DOA: 10/06/2017  PCP: Ronal FearLam, Lynn E, NP  Patient coming from: Home.  Chief Complaint: Abdominal pain.  HPI: Laurie Rubio Stolar is a 37 y.o. female with history of diabetes mellitus type 2 who has not been taking her insulin for almost a month usually follows up with endocrinologist Dr. Talmage NapBalan presents to the ER with complaints of abdominal pain.  Patient states over the last 48 hours patient has been having diffuse abdominal pain which started off as epigastric pain and back pain.  Gradually became more diffuse stabbing with nausea denies any vomiting or diarrhea.  Denies any fever chills chest pain or shortness of breath.  Patient had gone to her PCP and since there was right lower quadrant tenderness concern for appendicitis and patient was sent to the ER.  ED Course: Patient had CAT scan done which shows features concerning for pancreatitis versus duodenitis.  Lipase was mildly elevated.  Lab work also showed blood sugar of 359 with anion gap of 17.  Patient was started on IV fluid bolus and started on insulin infusion per DKA and admitted for further management.  Since patient has been having diffuse abdominal pain and tenderness on minimal palpation patient was started empirically on antibiotics by the ER physician.  On my exam patient does have diffuse tenderness but no rigidity or rebound tenderness.  Review of Systems: As per HPI, rest all negative.   Past Medical History:  Diagnosis Date  . Diabetes mellitus without complication (HCC)     History reviewed. No pertinent surgical history.   reports that  has never smoked. she has never used smokeless tobacco. She reports that she does not drink alcohol. Her drug history is not on file.  No Known Allergies  Family History  Problem Relation Age of Onset  . Hypertension Other     Prior to Admission medications   Medication Sig Start Date End Date Taking? Authorizing  Provider  metFORMIN (GLUCOPHAGE) 500 MG tablet Take 1,000 mg by mouth 2 (two) times daily with a meal.    Yes [provider]  predniSONE (DELTASONE) 20 MG tablet Take 2 tablets (40 mg total) by mouth daily. Patient not taking: Reported on 10/06/2017 08/14/14   Rodolph Bongorey, Evan S, MD  trimethoprim-polymyxin b Joaquim Lai(POLYTRIM) ophthalmic solution Place 2 drops into both eyes every 4 (four) hours. Patient not taking: Reported on 10/06/2017 08/14/14   Rodolph Bongorey, Evan S, MD    Physical Exam: Vitals:   10/06/17 2342 10/06/17 2345 10/07/17 0100 10/07/17 0145  BP:  115/71 113/65 (!) 109/55  Pulse:  (!) 109 (!) 105 (!) 105  Resp:   (!) 23 16  Temp: 99.8 Rubio (37.7 C)     TempSrc: Rectal     SpO2:  97% 94% 97%  Weight:      Height:          Constitutional: Moderately built and nourished. Vitals:   10/06/17 2342 10/06/17 2345 10/07/17 0100 10/07/17 0145  BP:  115/71 113/65 (!) 109/55  Pulse:  (!) 109 (!) 105 (!) 105  Resp:   (!) 23 16  Temp: 99.8 Rubio (37.7 C)     TempSrc: Rectal     SpO2:  97% 94% 97%  Weight:      Height:       Eyes: Anicteric no pallor. ENMT: No discharge from the ears eyes nose or mouth. Neck: No mass felt.  No JVD appreciated. Respiratory: No  rhonchi or crepitations. Cardiovascular: S1-S2 heard no murmurs appreciated. Abdomen: Diffuse tenderness no guarding or rigidity no rebound tenderness. Musculoskeletal: No edema. Skin: No rash. Neurologic: Alert awake oriented to time place and person.  Moves all extremities. Psychiatric: Appears normal.  Normal affect.   Labs on Admission: I have personally reviewed following labs and imaging studies  CBC: Recent Labs  Lab 10/06/17 1835  WBC 16.8*  HGB 14.9  HCT 42.7  MCV 78.9  PLT 294   Basic Metabolic Panel: Recent Labs  Lab 10/06/17 1835  NA 126*  K 3.9  CL 94*  CO2 15*  GLUCOSE 359*  BUN 14  CREATININE 0.82  CALCIUM 9.0   GFR: Estimated Creatinine Clearance: 104.4 mL/min (by C-G formula based on SCr  of 0.82 mg/dL). Liver Function Tests: Recent Labs  Lab 10/06/17 1835  AST 27  27  ALT 20  16  ALKPHOS 77  69  BILITOT 2.0*  2.1*  PROT 7.8  7.7  ALBUMIN 3.4*  3.5   Recent Labs  Lab 10/06/17 1835  LIPASE 63*   No results for input(s): AMMONIA in the last 168 hours. Coagulation Profile: No results for input(s): INR, PROTIME in the last 168 hours. Cardiac Enzymes: No results for input(s): CKTOTAL, CKMB, CKMBINDEX, TROPONINI in the last 168 hours. BNP (last 3 results) No results for input(s): PROBNP in the last 8760 hours. HbA1C: No results for input(s): HGBA1C in the last 72 hours. CBG: Recent Labs  Lab 10/06/17 2311 10/07/17 0034 10/07/17 0140  GLUCAP 361* 281* 292*   Lipid Profile: No results for input(s): CHOL, HDL, LDLCALC, TRIG, CHOLHDL, LDLDIRECT in the last 72 hours. Thyroid Function Tests: No results for input(s): TSH, T4TOTAL, FREET4, T3FREE, THYROIDAB in the last 72 hours. Anemia Panel: No results for input(s): VITAMINB12, FOLATE, FERRITIN, TIBC, IRON, RETICCTPCT in the last 72 hours. Urine analysis:    Component Value Date/Time   COLORURINE YELLOW 10/06/2017 2233   APPEARANCEUR CLEAR 10/06/2017 2233   LABSPEC >1.046 (H) 10/06/2017 2233   PHURINE 5.0 10/06/2017 2233   GLUCOSEU >=500 (A) 10/06/2017 2233   HGBUR MODERATE (A) 10/06/2017 2233   BILIRUBINUR NEGATIVE 10/06/2017 2233   KETONESUR 80 (A) 10/06/2017 2233   PROTEINUR 100 (A) 10/06/2017 2233   NITRITE NEGATIVE 10/06/2017 2233   LEUKOCYTESUR NEGATIVE 10/06/2017 2233   Sepsis Labs: @LABRCNTIP (procalcitonin:4,lacticidven:4) )No results found for this or any previous visit (from the past 240 hour(s)).   Radiological Exams on Admission: Ct Abdomen Pelvis W Contrast  Result Date: 10/06/2017 CLINICAL DATA:  Nausea right-sided abdominal pain EXAM: CT ABDOMEN AND PELVIS WITH CONTRAST TECHNIQUE: Multidetector CT imaging of the abdomen and pelvis was performed using the standard protocol following  bolus administration of intravenous contrast. CONTRAST:  ISOVUE-300 IOPAMIDOL (ISOVUE-300) INJECTION 61% COMPARISON:  None. FINDINGS: Lower chest: Lung bases demonstrate no acute consolidation or effusion. Normal heart size. Hepatobiliary: Hepatic steatosis. No calcified gallstones or biliary dilatation Pancreas: Fluid and edema inferior to the pancreatic head and uncinate process. No ductal dilatation. Homogeneous pancreas enhancement Spleen: Normal in size without focal abnormality. Adrenals/Urinary Tract: Adrenal glands are unremarkable. Kidneys are normal, without renal calculi, focal lesion, or hydronephrosis. Bladder is unremarkable. Stomach/Bowel: Stomach is nonenlarged. Focal wall thickening involving the third portion of duodenum. No extraluminal gas. No evidence for bowel obstruction. Normal appendix. No colon wall thickening Vascular/Lymphatic: Aortic atherosclerosis. No enlarged abdominal or pelvic lymph nodes. Reproductive: Uterus unremarkable. Probable arcuate configuration of the uterus. Small probable cysts within both ovaries. Other: Negative for  free air. Small amount of free fluid in the right anterior pararenal space. Musculoskeletal: No acute or significant osseous findings. IMPRESSION: 1. Moderate edema and fluid inferior to the pancreatic head and uncinate process and adjacent to the third portion of duodenum. Findings could be secondary to acute pancreatitis versus duodenitis. Third portion of duodenum is also thickened. There is no extraluminal gas in this region to suggest perforation. 2. Hepatic steatosis Electronically Signed   By: Jasmine Pang M.D.   On: 10/06/2017 22:08     Assessment/Plan Principal Problem:   DKA, type 2 (HCC) Active Problems:   Abdominal pain    1. Diabetic ketoacidosis -likely precipitated by patient's noncompliance with medications and also acute abdominal pain with poor oral intake.  Continue with aggressive IV hydration and IV insulin infusion.   Check hemoglobin A1c.  Follow metabolic panel closely until anion gap gets corrected and at that point will consider changing to long-acting subcutaneous insulin. 2. Abdominal pain with possible pancreatitis versus duodenitis -I have placed patient on Protonix IV and we will recheck lipase and LFTs.  For now patient will be n.p.o.  If pain persist may have to consider GI consult.  Check triglycerides for cause for possible pancreatitis, sInce patient does not drink alcohol and CAT scan does not show any gallstones.   DVT prophylaxis: SCDs for now.  No procedures anticipated may start Lovenox. Code Status: Full code. Family Communication: Discussed with patient. Disposition Plan: Home. Consults called: None. Admission status: Inpatient.   Eduard Clos MD [facility] Pager [phone].  If 7PM-7AM, please contact night-coverage [email or web address] Password [place] [place]  10/07/2017, 2:34 AM

## 2017-10-08 ENCOUNTER — Encounter: Payer: Self-pay | Admitting: Family Medicine

## 2017-10-08 LAB — COMPREHENSIVE METABOLIC PANEL
ALBUMIN: 2.4 g/dL — AB (ref 3.5–5.0)
ALT: 18 U/L (ref 14–54)
AST: 14 U/L — AB (ref 15–41)
Alkaline Phosphatase: 60 U/L (ref 38–126)
Anion gap: 11 (ref 5–15)
BUN: 8 mg/dL (ref 6–20)
CHLORIDE: 104 mmol/L (ref 101–111)
CO2: 22 mmol/L (ref 22–32)
Calcium: 8.4 mg/dL — ABNORMAL LOW (ref 8.9–10.3)
Creatinine, Ser: 0.65 mg/dL (ref 0.44–1.00)
GFR calc Af Amer: >60 mL/min (ref >60)
GFR calc non Af Amer: >60 mL/min (ref >60)
GLUCOSE: 283 mg/dL — AB (ref 65–99)
POTASSIUM: 3.4 mmol/L — AB (ref 3.5–5.1)
Sodium: 137 mmol/L (ref 135–145)
Total Bilirubin: 1.5 mg/dL — ABNORMAL HIGH (ref 0.3–1.2)
Total Protein: 6.4 g/dL — ABNORMAL LOW (ref 6.5–8.1)

## 2017-10-08 LAB — LIPASE, BLOOD: LIPASE: 42 U/L (ref 11–51)

## 2017-10-08 LAB — GLUCOSE, CAPILLARY
Glucose-Capillary: 252 mg/dL — ABNORMAL HIGH (ref 65–99)
Glucose-Capillary: 293 mg/dL — ABNORMAL HIGH (ref 65–99)

## 2017-10-08 MED ORDER — INSULIN LISPRO PROT & LISPRO (50-50 MIX) 100 UNIT/ML ~~LOC~~ SUSP: 8.0000 [IU] | Freq: Two times a day (BID) | SUBCUTANEOUS | 11 refills | Status: DC

## 2017-10-08 MED ORDER — ACETAMINOPHEN 325 MG PO TABS
650.0000 mg | ORAL_TABLET | Freq: Once | ORAL | Status: AC
  Administered 2017-10-08: 650 mg via ORAL
  Filled 2017-10-08: qty 2

## 2017-10-08 MED ORDER — FENOFIBRATE 145 MG PO TABS: 145.0000 mg | ORAL_TABLET | Freq: Every day | ORAL | 11 refills | Status: DC

## 2017-10-08 MED ORDER — ATORVASTATIN CALCIUM 40 MG PO TABS: 40.0000 mg | ORAL_TABLET | Freq: Every day | ORAL | Status: DC

## 2017-10-08 NOTE — Progress Notes (Signed)
Inpatient Diabetes Program Recommendations  AACE/ADA: New Consensus Statement on Inpatient Glycemic Control (2015)  Target Ranges:  Prepandial:   less than 140 mg/dL      Peak postprandial:   less than 180 mg/dL (1-2 hours)      Critically ill patients:  140 - 180 mg/dL   Lab Results  Component Value Date   GLUCAP 252 (H) 10/08/2017   HGBA1C 12.5 (H) 10/07/2017   Review of Glycemic Control  Diabetes history: DM 2 Outpatient Diabetes medications: Not taking was on 50/50 and Lantus and Novolog or Humalog before that, Metformin Current orders for Inpatient glycemic control: Novolog Sensitive Correction 0-9 units tid  Inpatient Diabetes Program Recommendations:    Consider Lantus 16-18 units (0.2 units/kg).  Thanks, Christena DeemShannon Naser Schuld RN, MSN, [facility] Inpatient Diabetes Coordinator Team Pager [phone] (8a-5p)

## 2017-10-08 NOTE — Discharge Summary (Signed)
Physician Discharge Summary  Laurie Rubio ZOX:[id number] DOB: [birth year] DOA: 10/06/2017  PCP: Ronal Fear, NP  Admit date: 10/06/2017 Discharge date: 10/08/2017  Time spent: 25 minutes  Recommendations for Outpatient Follow-up:  1. Get screening TG as OP in 6 weeks--needs lifestyle modif and counseling 2. refilled insulin 50/50 and given new glucometer 3. Follow with Dr. Talmage Nap as OP  Discharge Diagnoses:  Principal Problem:   DKA, type 2 (HCC) Active Problems:   Abdominal pain   Uncontrolled type 2 DM with peripheral circulatory disorder [facility])   Discharge Condition: improved  Diet recommendation:  diabetic  Filed Weights   10/06/17 2320 10/07/17 2116  Weight: 88.9 kg (196 lb) 90.8 kg (200 lb 2.8 oz)    History of present illness:  Noncompliant 37 year old female with type 2 diabetes mellitus admitted with abdominal pain--found to be in moderate to mild DKA start from insulin drip-further workup pending regarding whether pancreatitis or duodenitis given abdominal pain-lip[ase was 63  She has been told in the past she has very ? TG and was on meds for this but stopped 7 yr agho after her baby She stopped taking insulin ~ 1 mo agoi as she got busy with woirk and didn't order it-she was on a 50/50 mix through Dr. Miguel Dibble being evaluated for Insulin pump but as woujld cost 1000 $ did not oibtain  Her AG closed very quickly and she had some discomfort with eating and nausea which resolved on day 2 of hospital stay She stabilized subsequently on day of d/c and was given refills of insulin 50/50 and a glucometer and instructed to follow with endocrinology    Discharge Exam: Vitals:   10/08/17 0426 10/08/17 0807  BP: 119/72 120/76  Pulse: 93 88  Resp: 18 18  Temp: 98.6 F (37 C) 98.4 F (36.9 C)  SpO2: 95% 98%    General: awake alert pleasant in nad Cardiovascular: s1 s2 no m/r/g Respiratory: clear no added sound  Discharge Instructions   Discharge Instructions     Diet - low sodium heart healthy   Complete by:  As directed    Discharge instructions   Complete by:  As directed    Follow with your endocrinologist and titrate your insulin as needed You might have had a mild pancreatitis causing your DKA Eat small meals going forward-you also will need to get cholesterol rechecks in about 1 month as elevated triglycerides can be implicated with pancreatitis--we have rx for you a medicine that reduces cholesterol You will also be re-filled insulin 50/50   For home use only DME Glucometer   Complete by:  As directed    Increase activity slowly   Complete by:  As directed      Allergies as of 10/08/2017   No Known Allergies     Medication List    STOP taking these medications   predniSONE 20 MG tablet Commonly known as:  DELTASONE   trimethoprim-polymyxin b ophthalmic solution Commonly known as:  POLYTRIM     TAKE these medications   fenofibrate 145 MG tablet Commonly known as:  TRICOR Take 1 tablet (145 mg total) by mouth daily.   insulin lispro protamine-lispro (50-50) 100 UNIT/ML Susp injection Commonly known as:  HUMALOG 50/50 MIX Inject 0.08 mLs (8 Units total) into the skin 2 (two) times daily before a meal.   metFORMIN 500 MG tablet Commonly known as:  GLUCOPHAGE Take 1,000 mg by mouth 2 (two) times daily with a meal.  Durable Medical Equipment  (From admission, onward)        Start     Ordered   10/08/17 0000  For home use only DME Glucometer     10/08/17 1159     No Known Allergies    The results of significant diagnostics from this hospitalization (including imaging, microbiology, ancillary and laboratory) are listed below for reference.    Significant Diagnostic Studies: Ct Abdomen Pelvis W Contrast  Result Date: 10/06/2017 CLINICAL DATA:  Nausea right-sided abdominal pain EXAM: CT ABDOMEN AND PELVIS WITH CONTRAST TECHNIQUE: Multidetector CT imaging of the abdomen and pelvis was performed using  the standard protocol following bolus administration of intravenous contrast. CONTRAST:  100mL ISOVUE-300 IOPAMIDOL (ISOVUE-300) INJECTION 61% COMPARISON:  None. FINDINGS: Lower chest: Lung bases demonstrate no acute consolidation or effusion. Normal heart size. Hepatobiliary: Hepatic steatosis. No calcified gallstones or biliary dilatation Pancreas: Fluid and edema inferior to the pancreatic head and uncinate process. No ductal dilatation. Homogeneous pancreas enhancement Spleen: Normal in size without focal abnormality. Adrenals/Urinary Tract: Adrenal glands are unremarkable. Kidneys are normal, without renal calculi, focal lesion, or hydronephrosis. Bladder is unremarkable. Stomach/Bowel: Stomach is nonenlarged. Focal wall thickening involving the third portion of duodenum. No extraluminal gas. No evidence for bowel obstruction. Normal appendix. No colon wall thickening Vascular/Lymphatic: Aortic atherosclerosis. No enlarged abdominal or pelvic lymph nodes. Reproductive: Uterus unremarkable. Probable arcuate configuration of the uterus. Small probable cysts within both ovaries. Other: Negative for free air. Small amount of free fluid in the right anterior pararenal space. Musculoskeletal: No acute or significant osseous findings. IMPRESSION: 1. Moderate edema and fluid inferior to the pancreatic head and uncinate process and adjacent to the third portion of duodenum. Findings could be secondary to acute pancreatitis versus duodenitis. Third portion of duodenum is also thickened. There is no extraluminal gas in this region to suggest perforation. 2. Hepatic steatosis Electronically Signed   By: Jasmine PangKim  Fujinaga M.D.   On: 10/06/2017 22:08   Dg Chest Port 1 View  Result Date: 10/07/2017 CLINICAL DATA:  Diabetic ketoacidosis. EXAM: PORTABLE CHEST 1 VIEW COMPARISON:  Chest radiograph March 16, 2006 FINDINGS: Cardiomediastinal silhouette is normal. No pleural effusions or focal consolidations. Trachea projects midline  and there is no pneumothorax. Soft tissue planes and included osseous structures are non-suspicious. IMPRESSION: Negative. Electronically Signed   By: Awilda Metroourtnay  Bloomer M.D.   On: 10/07/2017 05:52    Microbiology: No results found for this or any previous visit (from the past 240 hour(s)).   Labs: Basic Metabolic Panel: Recent Labs  Lab 10/06/17 1835 10/07/17 0317 10/07/17 0715 10/07/17 1023 10/08/17 0651  NA 126* 133* 133* 135 137  K 3.9 3.1* 3.1* 3.2* 3.4*  CL 94* 105 105 106 104  CO2 15* 17* 20* 21* 22  GLUCOSE 359* 258* 220* 161* 283*  BUN 14 12 9 10 8   CREATININE 0.82 0.68 0.44 0.48 0.65  CALCIUM 9.0 8.0* 7.9* 8.3* 8.4*  MG  --  1.8  --   --   --    Liver Function Tests: Recent Labs  Lab 10/06/17 1835 10/07/17 0317 10/08/17 0651  AST 27  27 13* 14*  ALT 20  16 17 18   ALKPHOS 77  69 51 60  BILITOT 2.0*  2.1* 1.3* 1.5*  PROT 7.8  7.7 6.2* 6.4*  ALBUMIN 3.4*  3.5 2.7* 2.4*   Recent Labs  Lab 10/06/17 1835 10/08/17 0651  LIPASE 63* 42   No results for input(s): AMMONIA in the last 168  hours. CBC: Recent Labs  Lab 10/06/17 1835  WBC 16.8*  HGB 14.9  HCT 42.7  MCV 78.9  PLT 294   Cardiac Enzymes: Recent Labs  Lab 10/07/17 0317  TROPONINI <0.03   BNP: BNP (last 3 results) No results for input(s): BNP in the last 8760 hours.  ProBNP (last 3 results) No results for input(s): PROBNP in the last 8760 hours.  CBG: Recent Labs  Lab 10/07/17 1358 10/07/17 1634 10/07/17 2115 10/08/17 0749 10/08/17 1133  GLUCAP 242* 322* 296* 252* 293*       Signed:  Rhetta Mura MD   [facility] 10/08/2017, 12:00 PM

## 2017-10-12 LAB — CULTURE, BLOOD (ROUTINE X 2)
CULTURE: NO GROWTH
Culture: NO GROWTH
Special Requests: ADEQUATE
Special Requests: ADEQUATE

## 2017-11-19 ENCOUNTER — Other Ambulatory Visit: Payer: Self-pay | Admitting: Nurse Practitioner

## 2017-11-19 DIAGNOSIS — Z8639 Personal history of other endocrine, nutritional and metabolic disease: Secondary | ICD-10-CM

## 2017-11-26 ENCOUNTER — Other Ambulatory Visit: Payer: Managed Care, Other (non HMO)

## 2017-11-27 ENCOUNTER — Ambulatory Visit
Admission: RE | Admit: 2017-11-27 | Discharge: 2017-11-27 | Disposition: A | Discharge status: Home / Self Care | Payer: Managed Care, Other (non HMO) | Source: Ambulatory Visit | Attending: Nurse Practitioner | Admitting: Nurse Practitioner

## 2017-11-27 DIAGNOSIS — Z8639 Personal history of other endocrine, nutritional and metabolic disease: Secondary | ICD-10-CM

## 2021-09-28 ENCOUNTER — Other Ambulatory Visit: Payer: Self-pay

## 2021-09-28 ENCOUNTER — Emergency Department (HOSPITAL_COMMUNITY): Payer: 59

## 2021-09-28 ENCOUNTER — Emergency Department (HOSPITAL_COMMUNITY)
Admission: EM | Admit: 2021-09-28 | Discharge: 2021-09-28 | Disposition: A | Discharge status: Home / Self Care | Payer: 59 | Attending: Emergency Medicine | Admitting: Emergency Medicine

## 2021-09-28 ENCOUNTER — Encounter (HOSPITAL_COMMUNITY): Payer: Self-pay

## 2021-09-28 DIAGNOSIS — Z7984 Long term (current) use of oral hypoglycemic drugs: Secondary | ICD-10-CM | POA: Insufficient documentation

## 2021-09-28 DIAGNOSIS — Z794 Long term (current) use of insulin: Secondary | ICD-10-CM | POA: Insufficient documentation

## 2021-09-28 DIAGNOSIS — K760 Fatty (change of) liver, not elsewhere classified: Secondary | ICD-10-CM | POA: Insufficient documentation

## 2021-09-28 DIAGNOSIS — R1013 Epigastric pain: Secondary | ICD-10-CM | POA: Diagnosis present

## 2021-09-28 DIAGNOSIS — E1065 Type 1 diabetes mellitus with hyperglycemia: Secondary | ICD-10-CM | POA: Diagnosis not present

## 2021-09-28 DIAGNOSIS — R1084 Generalized abdominal pain: Secondary | ICD-10-CM

## 2021-09-28 DIAGNOSIS — R739 Hyperglycemia, unspecified: Secondary | ICD-10-CM

## 2021-09-28 HISTORY — DX: Acute pancreatitis without necrosis or infection, unspecified: K85.90

## 2021-09-28 LAB — URINALYSIS, ROUTINE W REFLEX MICROSCOPIC
Bilirubin Urine: NEGATIVE
Glucose, UA: >=500 mg/dL — AB
Hgb urine dipstick: NEGATIVE
Ketones, ur: NEGATIVE mg/dL
Leukocytes,Ua: NEGATIVE
Nitrite: NEGATIVE
Protein, ur: 100 mg/dL — AB
Specific Gravity, Urine: 1.022 (ref 1.005–1.030)
pH: 5 (ref 5.0–8.0)

## 2021-09-28 LAB — BLOOD GAS, VENOUS
Acid-Base Excess: 3.3 mmol/L — ABNORMAL HIGH (ref 0.0–2.0)
Bicarbonate: 28.9 mmol/L — ABNORMAL HIGH (ref 20.0–28.0)
O2 Saturation: 69 %
Patient temperature: 98.6
pCO2, Ven: 50.7 mmHg (ref 44.0–60.0)
pH, Ven: 7.374 (ref 7.250–7.430)
pO2, Ven: 39.5 mmHg (ref 32.0–45.0)

## 2021-09-28 LAB — COMPREHENSIVE METABOLIC PANEL
ALT: 28 U/L (ref 0–44)
AST: 21 U/L (ref 15–41)
Albumin: 3.6 g/dL (ref 3.5–5.0)
Alkaline Phosphatase: 51 U/L (ref 38–126)
Anion gap: 8 (ref 5–15)
BUN: 11 mg/dL (ref 6–20)
CO2: 26 mmol/L (ref 22–32)
Calcium: 8.5 mg/dL — ABNORMAL LOW (ref 8.9–10.3)
Chloride: 99 mmol/L (ref 98–111)
Creatinine, Ser: 0.62 mg/dL (ref 0.44–1.00)
GFR, Estimated: >60 mL/min (ref >60)
Glucose, Bld: 292 mg/dL — ABNORMAL HIGH (ref 70–99)
Potassium: 3.6 mmol/L (ref 3.5–5.1)
Sodium: 133 mmol/L — ABNORMAL LOW (ref 135–145)
Total Bilirubin: 0.9 mg/dL (ref 0.3–1.2)
Total Protein: 7.3 g/dL (ref 6.5–8.1)

## 2021-09-28 LAB — CBC WITH DIFFERENTIAL/PLATELET
Abs Immature Granulocytes: 0.02 10*3/uL (ref 0.00–0.07)
Basophils Absolute: 0.1 10*3/uL (ref 0.0–0.1)
Basophils Relative: 1 %
Eosinophils Absolute: 0.2 10*3/uL (ref 0.0–0.5)
Eosinophils Relative: 2 %
HCT: 39.2 % (ref 36.0–46.0)
Hemoglobin: 12.8 g/dL (ref 12.0–15.0)
Immature Granulocytes: 0 %
Lymphocytes Relative: 27 %
Lymphs Abs: 2.3 10*3/uL (ref 0.7–4.0)
MCH: 25.7 pg — ABNORMAL LOW (ref 26.0–34.0)
MCHC: 32.7 g/dL (ref 30.0–36.0)
MCV: 78.6 fL — ABNORMAL LOW (ref 80.0–100.0)
Monocytes Absolute: 0.6 10*3/uL (ref 0.1–1.0)
Monocytes Relative: 7 %
Neutro Abs: 5.4 10*3/uL (ref 1.7–7.7)
Neutrophils Relative %: 63 %
Platelets: 229 10*3/uL (ref 150–400)
RBC: 4.99 MIL/uL (ref 3.87–5.11)
RDW: 13.8 % (ref 11.5–15.5)
WBC: 8.6 10*3/uL (ref 4.0–10.5)
nRBC: 0 % (ref 0.0–0.2)

## 2021-09-28 LAB — CBG MONITORING, ED: Glucose-Capillary: 250 mg/dL — ABNORMAL HIGH (ref 70–99)

## 2021-09-28 LAB — PREGNANCY, URINE: Preg Test, Ur: NEGATIVE

## 2021-09-28 LAB — LIPASE, BLOOD: Lipase: 29 U/L (ref 11–51)

## 2021-09-28 MED ORDER — SODIUM CHLORIDE 0.9 % IV BOLUS: 1000.0000 mL | Freq: Once | INTRAVENOUS | Status: DC

## 2021-09-28 MED ORDER — ONDANSETRON HCL 4 MG/2ML IJ SOLN: 4.0000 mg | Freq: Once | INTRAMUSCULAR | Status: DC

## 2021-09-28 MED ORDER — SODIUM CHLORIDE 0.9 % IV SOLN: INTRAVENOUS | Status: DC

## 2021-09-28 MED ORDER — SUCRALFATE 1 GM/10ML PO SUSP
1.0000 g | Freq: Once | ORAL | Status: AC
  Administered 2021-09-28: 1 g via ORAL
  Filled 2021-09-28: qty 10

## 2021-09-28 MED ORDER — IOHEXOL 350 MG/ML SOLN
80.0000 mL | Freq: Once | INTRAVENOUS | Status: AC | PRN
  Administered 2021-09-28: 80 mL via INTRAVENOUS

## 2021-09-28 NOTE — ED Triage Notes (Addendum)
Patient c/o upper abdominal pain x 2 days. Patient also reports slight nausea.Patient reports that her CBG was 270 at her physician's office. Patient was sent to the ED for further evaluation. CBG in triage-250. Patient states she has been non compliant with insulin and states she just placed her Dexcom on yesterday.

## 2021-09-28 NOTE — ED Provider Notes (Signed)
[facility]-EMERGENCY DEPT Provider Note   CSN: [id number] Arrival date & time: 09/28/21  0856     History  Chief Complaint  Patient presents with   Abdominal Pain   Hyperglycemia         Laurie Rubio is a 41 y.o. female.  HPI Adult female with insulin-dependent diabetes, prior pancreatitis presents with abdominal pain, nausea, hyperglycemia.  She was seen and evaluated by her endocrinologist, referred here with concerns.  She notes poor medication compliance for unclear reasons recently.  Over the past 2 or 3 days she has had worsening nausea, and pain largely in the epigastrium, but diffuse in the abdomen.  No actual vomiting in about 1 week.  With substantially elevated glucose levels at home she went to her endocrinologist, and as above was sent here for evaluation.  No chest pain, dyspnea, no fever.    Home Medications Prior to Admission medications   Medication Sig Start Date End Date Taking? Authorizing Provider  fenofibrate (TRICOR) 145 MG tablet Take 1 tablet (145 mg total) by mouth daily. 10/08/17 10/08/18  Rhetta Mura, MD  insulin lispro protamine-lispro (HUMALOG 50/50 MIX) (50-50) 100 UNIT/ML SUSP injection Inject 0.08 mLs (8 Units total) into the skin 2 (two) times daily before a meal. 10/08/17   Rhetta Mura, MD  metFORMIN (GLUCOPHAGE) 500 MG tablet Take 1,000 mg by mouth 2 (two) times daily with a meal.     [provider]      Allergies    Patient has no known allergies.    Review of Systems   Review of Systems  Constitutional:        Per HPI, otherwise negative  HENT:         Per HPI, otherwise negative  Respiratory:         Per HPI, otherwise negative  Cardiovascular:        Per HPI, otherwise negative  Gastrointestinal:  Positive for nausea. Negative for vomiting.  Endocrine:       Negative aside from HPI  Genitourinary:        Neg aside from HPI   Musculoskeletal:        Per HPI, otherwise negative   Skin: Negative.   Neurological:  Negative for syncope.   Physical Exam Updated Vital Signs BP (!) 142/86 (BP Location: Left Arm)    Pulse 92    Temp 98.2 F (36.8 C) (Oral)    Resp 17    Ht 5\' 5"  (1.651 m)    Wt 102.5 kg    LMP 09/05/2021    SpO2 93%    BMI 37.61 kg/m  Physical Exam Vitals and nursing note reviewed.  Constitutional:      General: She is not in acute distress.    Appearance: She is well-developed.  HENT:     Head: Normocephalic and atraumatic.  Eyes:     Conjunctiva/sclera: Conjunctivae normal.  Cardiovascular:     Rate and Rhythm: Normal rate and regular rhythm.  Pulmonary:     Effort: Pulmonary effort is normal. No respiratory distress.     Breath sounds: Normal breath sounds. No stridor.  Abdominal:     General: There is no distension.     Tenderness: There is generalized abdominal tenderness and tenderness in the right lower quadrant. There is guarding.  Skin:    General: Skin is warm and dry.  Neurological:     Mental Status: She is alert and oriented to person, place, and time.  Cranial Nerves: No cranial nerve deficit.    ED Results / Procedures / Treatments   Labs (all labs ordered are listed, but only abnormal results are displayed) Labs Reviewed  CBG MONITORING, ED - Abnormal; Notable for the following components:      Result Value   Glucose-Capillary 250 (*)    All other components within normal limits  COMPREHENSIVE METABOLIC PANEL  LIPASE, BLOOD  CBC WITH DIFFERENTIAL/PLATELET  URINALYSIS, ROUTINE W REFLEX MICROSCOPIC  BLOOD GAS, VENOUS  I-STAT BETA HCG BLOOD, ED ([facility], [facility], [facility] ONLY)    EKG None  Radiology No results found.  Procedures Procedures    Medications Ordered in ED Medications  ondansetron (ZOFRAN) injection 4 mg (has no administration in time range)  sodium chloride 0.9 % bolus 1,000 mL (has no administration in time range)    And  0.9 %  sodium chloride infusion (has no administration in time range)  sucralfate  (CARAFATE) 1 GM/10ML suspension 1 g (has no administration in time range)    ED Course/ Medical Decision Making/ A&P                           Medical Decision Making Adult female with insulin-dependent diabetes, prior pancreatitis presents with abdominal pain, nausea, hyperglycemia.  Initial considerations including DKA, pancreatitis, or other intra-abdominal infection such as appendicitis given her diffuse tenderness with focal pain in the right lower quadrant all considered.  Patient had initial interventions including antiemetics, fluid resuscitation, CT, labs all ordered.  4:24 PM Patient awake, alert, in no distress. Adult female with insulin-dependent diabetes, prior episodes of pancreatitis presents with abdominal pain.  Differential as above, findings, including CT scan reviewed, interpreted by myself, findings most notable for hyperglycemia, no evidence for gap acidosis, electrolytes reassuring.  CT without acute abdominal processes such as abscess, other infection such as diverticulitis.  With reassuring findings, resolution of symptoms here patient discharged to follow-up as an outpatient.        Final Clinical Impression(s) / ED Diagnoses Final diagnoses:  Generalized abdominal pain  Hyperglycemia     Gerhard Munch, MD 09/28/21 1624

## 2021-09-28 NOTE — Discharge Instructions (Signed)
As discussed, your evaluation today has been largely reassuring.  But, it is important that you monitor your condition carefully, and do not hesitate to return to the ED if you develop new, or concerning changes in your condition.  Otherwise, please follow-up with your physician for appropriate ongoing care.   It is very important that you take all of your medication as directed.

## 2022-07-17 ENCOUNTER — Other Ambulatory Visit (HOSPITAL_COMMUNITY): Payer: Self-pay | Admitting: Endocrinology

## 2022-07-17 DIAGNOSIS — E1065 Type 1 diabetes mellitus with hyperglycemia: Secondary | ICD-10-CM

## 2022-07-24 ENCOUNTER — Ambulatory Visit (HOSPITAL_COMMUNITY): Payer: 59

## 2022-07-24 ENCOUNTER — Encounter (HOSPITAL_COMMUNITY): Payer: Self-pay

## 2022-07-30 ENCOUNTER — Ambulatory Visit (HOSPITAL_COMMUNITY)
Admission: RE | Admit: 2022-07-30 | Discharge: 2022-07-30 | Disposition: A | Discharge status: Home / Self Care | Payer: 59 | Source: Ambulatory Visit | Attending: Endocrinology | Admitting: Endocrinology

## 2022-07-30 ENCOUNTER — Encounter (HOSPITAL_COMMUNITY): Payer: Self-pay

## 2022-07-30 DIAGNOSIS — E1065 Type 1 diabetes mellitus with hyperglycemia: Secondary | ICD-10-CM | POA: Insufficient documentation

## 2022-12-16 ENCOUNTER — Other Ambulatory Visit: Payer: Self-pay | Admitting: Family Medicine

## 2022-12-16 DIAGNOSIS — Z1231 Encounter for screening mammogram for malignant neoplasm of breast: Secondary | ICD-10-CM

## 2023-02-07 ENCOUNTER — Ambulatory Visit: Payer: 59

## 2023-02-12 ENCOUNTER — Other Ambulatory Visit: Payer: Self-pay | Admitting: Endocrinology

## 2023-02-12 DIAGNOSIS — E049 Nontoxic goiter, unspecified: Secondary | ICD-10-CM

## 2023-02-24 ENCOUNTER — Ambulatory Visit
Admission: RE | Admit: 2023-02-24 | Discharge: 2023-02-24 | Disposition: A | Discharge status: Home / Self Care | Payer: Managed Care, Other (non HMO) | Source: Ambulatory Visit | Attending: Endocrinology | Admitting: Endocrinology

## 2023-02-24 DIAGNOSIS — E049 Nontoxic goiter, unspecified: Secondary | ICD-10-CM

## 2023-02-26 ENCOUNTER — Other Ambulatory Visit: Payer: 59

## 2023-03-04 ENCOUNTER — Other Ambulatory Visit: Payer: Self-pay | Admitting: Endocrinology

## 2023-03-04 DIAGNOSIS — E049 Nontoxic goiter, unspecified: Secondary | ICD-10-CM

## 2023-04-18 ENCOUNTER — Other Ambulatory Visit: Payer: Managed Care, Other (non HMO)

## 2023-04-25 ENCOUNTER — Ambulatory Visit
Admission: RE | Admit: 2023-04-25 | Discharge: 2023-04-25 | Disposition: A | Discharge status: Home / Self Care | Payer: Managed Care, Other (non HMO) | Source: Ambulatory Visit | Attending: Endocrinology | Admitting: Endocrinology

## 2023-04-25 ENCOUNTER — Other Ambulatory Visit (HOSPITAL_COMMUNITY)
Admission: RE | Admit: 2023-04-25 | Discharge: 2023-04-25 | Disposition: A | Discharge status: Home / Self Care | Payer: Managed Care, Other (non HMO) | Source: Ambulatory Visit | Attending: Endocrinology | Admitting: Endocrinology

## 2023-04-25 DIAGNOSIS — E049 Nontoxic goiter, unspecified: Secondary | ICD-10-CM

## 2023-04-25 DIAGNOSIS — E041 Nontoxic single thyroid nodule: Secondary | ICD-10-CM | POA: Diagnosis present

## 2023-05-22 ENCOUNTER — Other Ambulatory Visit: Payer: Self-pay | Admitting: Oncology

## 2023-05-22 DIAGNOSIS — Z006 Encounter for examination for normal comparison and control in clinical research program: Secondary | ICD-10-CM

## 2023-06-04 ENCOUNTER — Other Ambulatory Visit: Payer: Self-pay

## 2023-06-04 ENCOUNTER — Inpatient Hospital Stay (HOSPITAL_COMMUNITY)
Admission: EM | Admit: 2023-06-04 | Discharge: 2023-06-07 | DRG: 603 | Disposition: A | Discharge status: Home / Self Care | Payer: Managed Care, Other (non HMO) | Source: Ambulatory Visit | Attending: Student | Admitting: Student

## 2023-06-04 ENCOUNTER — Encounter (HOSPITAL_COMMUNITY): Payer: Self-pay | Admitting: Emergency Medicine

## 2023-06-04 ENCOUNTER — Emergency Department (HOSPITAL_COMMUNITY): Payer: Managed Care, Other (non HMO)

## 2023-06-04 DIAGNOSIS — R7989 Other specified abnormal findings of blood chemistry: Secondary | ICD-10-CM | POA: Diagnosis present

## 2023-06-04 DIAGNOSIS — L0211 Cutaneous abscess of neck: Secondary | ICD-10-CM | POA: Diagnosis not present

## 2023-06-04 DIAGNOSIS — I10 Essential (primary) hypertension: Secondary | ICD-10-CM | POA: Diagnosis not present

## 2023-06-04 DIAGNOSIS — Z833 Family history of diabetes mellitus: Secondary | ICD-10-CM

## 2023-06-04 DIAGNOSIS — Z7984 Long term (current) use of oral hypoglycemic drugs: Secondary | ICD-10-CM

## 2023-06-04 DIAGNOSIS — Z794 Long term (current) use of insulin: Secondary | ICD-10-CM

## 2023-06-04 DIAGNOSIS — D649 Anemia, unspecified: Secondary | ICD-10-CM | POA: Diagnosis present

## 2023-06-04 DIAGNOSIS — Z6837 Body mass index (BMI) 37.0-37.9, adult: Secondary | ICD-10-CM

## 2023-06-04 DIAGNOSIS — E118 Type 2 diabetes mellitus with unspecified complications: Secondary | ICD-10-CM | POA: Diagnosis not present

## 2023-06-04 DIAGNOSIS — W1839XA Other fall on same level, initial encounter: Secondary | ICD-10-CM | POA: Diagnosis present

## 2023-06-04 DIAGNOSIS — E872 Acidosis, unspecified: Secondary | ICD-10-CM | POA: Diagnosis present

## 2023-06-04 DIAGNOSIS — Z79899 Other long term (current) drug therapy: Secondary | ICD-10-CM

## 2023-06-04 DIAGNOSIS — L03221 Cellulitis of neck: Principal | ICD-10-CM

## 2023-06-04 DIAGNOSIS — L0390 Cellulitis, unspecified: Secondary | ICD-10-CM | POA: Diagnosis present

## 2023-06-04 DIAGNOSIS — Z9641 Presence of insulin pump (external) (internal): Secondary | ICD-10-CM | POA: Diagnosis present

## 2023-06-04 DIAGNOSIS — E1165 Type 2 diabetes mellitus with hyperglycemia: Secondary | ICD-10-CM | POA: Diagnosis present

## 2023-06-04 DIAGNOSIS — R000 Tachycardia, unspecified: Secondary | ICD-10-CM | POA: Diagnosis present

## 2023-06-04 DIAGNOSIS — E876 Hypokalemia: Secondary | ICD-10-CM | POA: Diagnosis present

## 2023-06-04 DIAGNOSIS — Z8249 Family history of ischemic heart disease and other diseases of the circulatory system: Secondary | ICD-10-CM

## 2023-06-04 DIAGNOSIS — E785 Hyperlipidemia, unspecified: Secondary | ICD-10-CM | POA: Diagnosis present

## 2023-06-04 DIAGNOSIS — Z83438 Family history of other disorder of lipoprotein metabolism and other lipidemia: Secondary | ICD-10-CM

## 2023-06-04 LAB — COMPREHENSIVE METABOLIC PANEL
ALT: 36 U/L (ref 0–44)
AST: 31 U/L (ref 15–41)
Albumin: 3.6 g/dL (ref 3.5–5.0)
Alkaline Phosphatase: 31 U/L — ABNORMAL LOW (ref 38–126)
Anion gap: 9 (ref 5–15)
BUN: 12 mg/dL (ref 6–20)
CO2: 26 mmol/L (ref 22–32)
Calcium: 8.8 mg/dL — ABNORMAL LOW (ref 8.9–10.3)
Chloride: 101 mmol/L (ref 98–111)
Creatinine, Ser: 0.84 mg/dL (ref 0.44–1.00)
GFR, Estimated: >60 mL/min (ref >60)
Glucose, Bld: 284 mg/dL — ABNORMAL HIGH (ref 70–99)
Potassium: 3.7 mmol/L (ref 3.5–5.1)
Sodium: 136 mmol/L (ref 135–145)
Total Bilirubin: 0.7 mg/dL (ref 0.3–1.2)
Total Protein: 7.2 g/dL (ref 6.5–8.1)

## 2023-06-04 LAB — CBC
HCT: 37.1 % (ref 36.0–46.0)
Hemoglobin: 11.8 g/dL — ABNORMAL LOW (ref 12.0–15.0)
MCH: 26 pg (ref 26.0–34.0)
MCHC: 31.8 g/dL (ref 30.0–36.0)
MCV: 81.9 fL (ref 80.0–100.0)
Platelets: 275 10*3/uL (ref 150–400)
RBC: 4.53 MIL/uL (ref 3.87–5.11)
RDW: 13.2 % (ref 11.5–15.5)
WBC: 10.6 10*3/uL — ABNORMAL HIGH (ref 4.0–10.5)
nRBC: 0 % (ref 0.0–0.2)

## 2023-06-04 LAB — I-STAT CG4 LACTIC ACID, ED: Lactic Acid, Venous: 2 mmol/L (ref 0.5–1.9)

## 2023-06-04 LAB — CBG MONITORING, ED: Glucose-Capillary: 226 mg/dL — ABNORMAL HIGH (ref 70–99)

## 2023-06-04 MED ORDER — ONDANSETRON HCL 4 MG/2ML IJ SOLN
4.0000 mg | Freq: Once | INTRAMUSCULAR | Status: AC | PRN
  Administered 2023-06-04: 4 mg via INTRAVENOUS
  Filled 2023-06-04: qty 2

## 2023-06-04 MED ORDER — INSULIN ASPART 100 UNIT/ML IJ SOLN
0.0000 [IU] | INTRAMUSCULAR | Status: DC
  Administered 2023-06-04 – 2023-06-05 (×2): 3 [IU] via SUBCUTANEOUS
  Administered 2023-06-05: 5 [IU] via SUBCUTANEOUS
  Administered 2023-06-05: 7 [IU] via SUBCUTANEOUS
  Administered 2023-06-05: 9 [IU] via SUBCUTANEOUS
  Administered 2023-06-05: 3 [IU] via SUBCUTANEOUS
  Administered 2023-06-06: 5 [IU] via SUBCUTANEOUS
  Administered 2023-06-06 (×2): 7 [IU] via SUBCUTANEOUS
  Administered 2023-06-06: 5 [IU] via SUBCUTANEOUS

## 2023-06-04 MED ORDER — CEFAZOLIN SODIUM-DEXTROSE 2-4 GM/100ML-% IV SOLN
2.0000 g | Freq: Once | INTRAVENOUS | Status: AC
  Administered 2023-06-04: 2 g via INTRAVENOUS
  Filled 2023-06-04: qty 100

## 2023-06-04 MED ORDER — SODIUM CHLORIDE 0.9 % IV SOLN
2.0000 g | INTRAVENOUS | Status: DC
  Administered 2023-06-05 – 2023-06-06 (×2): 2 g via INTRAVENOUS
  Filled 2023-06-04 (×2): qty 20

## 2023-06-04 MED ORDER — MORPHINE SULFATE (PF) 4 MG/ML IV SOLN
4.0000 mg | Freq: Once | INTRAVENOUS | Status: AC
  Administered 2023-06-04: 4 mg via INTRAVENOUS
  Filled 2023-06-04: qty 1

## 2023-06-04 MED ORDER — IOHEXOL 350 MG/ML SOLN
75.0000 mL | Freq: Once | INTRAVENOUS | Status: AC | PRN
  Administered 2023-06-04: 75 mL via INTRAVENOUS

## 2023-06-04 MED ORDER — VANCOMYCIN HCL 2000 MG/400ML IV SOLN
2000.0000 mg | Freq: Once | INTRAVENOUS | Status: AC
  Administered 2023-06-04: 2000 mg via INTRAVENOUS
  Filled 2023-06-04: qty 400

## 2023-06-04 NOTE — Assessment & Plan Note (Signed)
-  admit per  cellulitis protocol will      Add vancomycin given purulent discharge,       continue rocephin vanc      plain films showed:   no evidence of air  no evidence of  abscess   no               foreign   objects        Will obtain MRSA screening,           further antibiotic adjustment pending above results

## 2023-06-04 NOTE — ED Triage Notes (Signed)
Pt here from Md office for imaging of her neck for and abscess  that has not been healing , pt has been on two rounds of antibiotics without relief ,

## 2023-06-04 NOTE — Assessment & Plan Note (Signed)
Continue home medications fenofibrate and Vascepa she also states she is taking  Lipitor Patient history of severe triglyceridemia in the past we will repeat lipid panel

## 2023-06-04 NOTE — Assessment & Plan Note (Signed)
-   Order Sensitive SSI   - continue home insulin but decreased to  NPH 15 units BID  -  check TSH and HgA1C  - Hold by mouth medications

## 2023-06-04 NOTE — Progress Notes (Signed)
Pharmacy Antibiotic Note  Laurie Rubio is a 42 y.o. female presented on 06/04/2023 with abscess from providers office with concern for cellulitis. Failed outpatient therapy with augmentin x 7 days and clindamycin x 7 days. Vancomycin x 1 in ED. Pharmacy has been consulted for vancomycin dosing.  Plan: Vancomycin 1250mg  q12h (eAUC 496, Scr 0.84) F/u renal function, length of therapy and vancomycin levels as needed   Height: 5\' 5"  (165.1 cm) Weight: 102.5 kg (225 lb 15.5 oz) IBW/kg (Calculated) : 57  Temp (24hrs), Avg:98.7 F (37.1 C), Min:98.6 F (37 C), Max:98.8 F (37.1 C)  Recent Labs  Lab 06/04/23 1614 06/04/23 2041  WBC 10.6*  --   CREATININE 0.84  --   LATICACIDVEN  --  2.0*    Estimated Creatinine Clearance: 101.5 mL/min (by C-G formula based on SCr of 0.84 mg/dL).    No Known Allergies  Antimicrobials this admission: Vancomycin 9/11 >  Microbiology results: 9/11 MRSA PCR: pending  Thank you for allowing pharmacy to be a part of this patient's care.  Marja Kays 06/04/2023 11:53 PM

## 2023-06-04 NOTE — Subjective & Objective (Addendum)
Patient for the past 1 month had induration and swelling on the back of her neck.  She does not recollect any trauma.  She was seen by primary care provider who attempted to drain it but nothing came out.  Patient also reports she usually uses insulin pump although has run out of insulin just today.  Her blood sugar has been running elevated in 200s.  She is about to go see her endocrinologist in the near future.  Patient states she had a fall few weeks back when she tried to stand up from the bed and stood up too fast and fell forward hitting her head no LOC.  Patient not on anticoagulation no neurological complaints since. Denies any fevers or chills she did get a little nauseous but only after getting morphine none before that

## 2023-06-04 NOTE — ED Notes (Signed)
Patient transported to CT 

## 2023-06-04 NOTE — ED Provider Notes (Signed)
[facility] EMERGENCY DEPARTMENT AT [facility] Provider Note   CSN: [id number] Arrival date & time: 06/04/23  1439     History  Chief Complaint  Patient presents with   Abscess    Laurie Rubio is a 42 y.o. female.  42 year old female here today after she went to a general surgery office for evaluation of a possible neck abscess.  Patient had I&D done on 9/2 by surgery, followed up today.  She had tachycardia, they sent her here for imaging and IV antibiotics.   Abscess      Home Medications Prior to Admission medications   Medication Sig Start Date End Date Taking? Authorizing Provider  fenofibrate (TRICOR) 145 MG tablet Take 1 tablet (145 mg total) by mouth daily. 10/08/17 10/08/18  Rhetta Mura, MD  insulin lispro protamine-lispro (HUMALOG 50/50 MIX) (50-50) 100 UNIT/ML SUSP injection Inject 0.08 mLs (8 Units total) into the skin 2 (two) times daily before a meal. 10/08/17   Rhetta Mura, MD  metFORMIN (GLUCOPHAGE) 500 MG tablet Take 1,000 mg by mouth 2 (two) times daily with a meal.     [provider]      Allergies    Patient has no known allergies.    Review of Systems   Review of Systems  Physical Exam Updated Vital Signs BP 128/72 (BP Location: Right Arm)   Pulse 98   Temp 98.6 F (37 C) (Oral)   Resp 18   Ht 5\' 5"  (1.651 m)   Wt 102.5 kg   SpO2 99%   BMI 37.60 kg/m  Physical Exam Vitals reviewed.  Skin:    Comments: At the nape of the neck, there is an area of induration, erythema, small ulceration.  No crepitus.  Neurological:     Mental Status: She is alert.     ED Results / Procedures / Treatments   Labs (all labs ordered are listed, but only abnormal results are displayed) Labs Reviewed  CBC - Abnormal; Notable for the following components:      Result Value   WBC 10.6 (*)    Hemoglobin 11.8 (*)    All other components within normal limits  COMPREHENSIVE METABOLIC PANEL - Abnormal; Notable for the  following components:   Glucose, Bld 284 (*)    Calcium 8.8 (*)    Alkaline Phosphatase 31 (*)    All other components within normal limits  I-STAT CG4 LACTIC ACID, ED - Abnormal; Notable for the following components:   Lactic Acid, Venous 2.0 (*)    All other components within normal limits  I-STAT CG4 LACTIC ACID, ED    EKG None  Radiology CT Soft Tissue Neck W Contrast  Result Date: 06/04/2023 CLINICAL DATA:  Poorly healing abscess of the neck EXAM: CT NECK WITH CONTRAST TECHNIQUE: Multidetector CT imaging of the neck was performed using the standard protocol following the bolus administration of intravenous contrast. RADIATION DOSE REDUCTION: This exam was performed according to the departmental dose-optimization program which includes automated exposure control, adjustment of the mA and/or kV according to patient size and/or use of iterative reconstruction technique. CONTRAST:  75mL OMNIPAQUE IOHEXOL 350 MG/ML SOLN COMPARISON:  None Available. FINDINGS: Pharynx and larynx: Normal Salivary glands: Normal Thyroid: Enlarged and heterogeneous thyroid gland has been previously evaluated with ultrasound. Lymph nodes: No enlarged or abnormal density lymph nodes. Vascular: Negative Limited intracranial: Normal Visualized orbits: Normal Mastoids and visualized paranasal sinuses: Clear Skeleton: Negative Upper chest: Clear Other: No abscess or fluid collection within  the soft tissues at the posterior base of the neck IMPRESSION: 1. No abscess or fluid collection within the soft tissues at the posterior base of the neck. 2. Enlarged and heterogeneous thyroid gland has been previously evaluated with ultrasound. These results were called by telephone at the time of interpretation on 06/04/2023 at 10:21 pm to Dr. Andria Meuse, who verbally acknowledged these results. Electronically Signed   By: Deatra Robinson M.D.   On: 06/04/2023 22:21    Procedures Procedures    Medications Ordered in ED Medications   ceFAZolin (ANCEF) IVPB 2g/100 mL premix (has no administration in time range)  iohexol (OMNIPAQUE) 350 MG/ML injection 75 mL (75 mLs Intravenous Contrast Given 06/04/23 2052)  morphine (PF) 4 MG/ML injection 4 mg (4 mg Intravenous Given 06/04/23 2206)    ED Course/ Medical Decision Making/ A&P                                 Medical Decision Making 42 year old female here today for likely abscess, cellulitis which is failed outpatient therapy.  Plan-patient not systemically ill, but does certainly appear to be an infection at the nape of the neck.  Will obtain imaging to assess for abscess.  Patient likely require IV antibiotics.  Patient previously has received Augmentin and clindamycin.  Reassessment-CT imaging of the patient's neck does not show any abscess.  I spoke on the phone with the reading radiologist.  Will admit patient with IV antibiotics.  Analgesia is been provided.  Patient not systemically ill.  This patient does not have sepsis.  Amount and/or Complexity of Data Reviewed Labs: ordered.  Risk Prescription drug management.           Final Clinical Impression(s) / ED Diagnoses Final diagnoses:  Cellulitis, neck    Rx / DC Orders ED Discharge Orders     None         Arletha Pili, DO 06/04/23 2230

## 2023-06-04 NOTE — H&P (Signed)
Laurie Rubio:[id number] DOB: November 14, 1980 DOA: 06/04/2023     PCP: Ailene Ravel, MD   Endocrinology at [facility]  Patient arrived to ER on 06/04/23 at 1439 Referred by Attending Therisa Doyne, MD   Patient coming from:    home Lives  With family      Chief Complaint:   Chief Complaint  Patient presents with   Abscess    HPI: DALETH Rubio is a 42 y.o. female with medical history significant of DM2, HLD, HTN    Presented with cellulitis of the back of her neck Patient for the past 1 month had induration and swelling on the back of her neck.  She does not recollect any trauma.  She was seen by primary care provider who attempted to drain it but nothing came out.  Patient also reports she usually uses insulin pump although has run out of insulin just today.  Her blood sugar has been running elevated in 200s.  She is about to go see her endocrinologist in the near future.  Patient states she had a fall few weeks back when she tried to stand up from the bed and stood up too fast and fell forward hitting her head no LOC.  Patient not on anticoagulation no neurological complaints since. Denies any fevers or chills she did get a little nauseous but only after getting morphine none before that  Patient had I&D done by surgery on 2 September on the follow-up today noted to be somewhat tachycardic and was sent in the emergency department for IV antibiotics and imaging Denies significant ETOH intake   Does not smoke   No results found for: "SARSCOV2NAA"      Regarding pertinent Chronic problems:    Hyperlipidemia - on statins Lipitor (atorvastatin)  Lipid Panel     Component Value Date/Time   TRIG 461 (H) 10/07/2017 0715     HTN on lisinopril   DM 2 -  Lab Results  Component Value Date   HGBA1C 12.5 (H) 10/07/2017   on insulin NPH twice daily and insulin pump for coverage   obesity-   BMI Readings from Last 1 Encounters:  06/04/23 37.60 kg/m   Chronic anemia -  baseline hg Hemoglobin & Hematocrit  Recent Labs    06/04/23 1614  HGB 11.8*      While in ER:   CT neck no abscess Thyroid goiter noted which has been evaluated before    Lab Orders         CBC         Comprehensive metabolic panel         CK         Magnesium         Phosphorus         TSH         Prealbumin         Procalcitonin         Hemoglobin A1c         I-Stat CG4 Lactic Acid         CBG monitoring, ED         I-Stat CG4 Lactic Acid      CT neck  NON acute no evidence of abscess goiter present     Following Medications were ordered in ER: Medications  vancomycin (VANCOREADY) IVPB 2000 mg/400 mL (has no administration in time range)  insulin aspart (novoLOG) injection 0-9 Units (3 Units Subcutaneous Given 06/04/23 2322)  iohexol (OMNIPAQUE)  350 MG/ML injection 75 mL (75 mLs Intravenous Contrast Given 06/04/23 2052)  morphine (PF) 4 MG/ML injection 4 mg (4 mg Intravenous Given 06/04/23 2206)  ceFAZolin (ANCEF) IVPB 2g/100 mL premix (0 g Intravenous Stopped 06/04/23 2317)  ondansetron (ZOFRAN) injection 4 mg (4 mg Intravenous Given 06/04/23 2245)        ED Triage Vitals  Encounter Vitals Group     BP 06/04/23 1600 131/73     Systolic BP Percentile --      Diastolic BP Percentile --      Pulse Rate 06/04/23 1600 (!) 103     Resp 06/04/23 1600 18     Temp 06/04/23 1600 98.8 F (37.1 C)     Temp Source 06/04/23 1600 Oral     SpO2 06/04/23 1600 97 %     Weight 06/04/23 2139 225 lb 15.5 oz (102.5 kg)     Height 06/04/23 2139 5\' 5"  (1.651 m)     Head Circumference --      Peak Flow --      Pain Score 06/04/23 1604 5     Pain Loc --      Pain Education --      Exclude from Growth Chart --   NWGN(56)@     _________________________________________ Significant initial  Findings: Abnormal Labs Reviewed  CBC - Abnormal; Notable for the following components:      Result Value   WBC 10.6 (*)    Hemoglobin 11.8 (*)    All other components within normal limits   COMPREHENSIVE METABOLIC PANEL - Abnormal; Notable for the following components:   Glucose, Bld 284 (*)    Calcium 8.8 (*)    Alkaline Phosphatase 31 (*)    All other components within normal limits  I-STAT CG4 LACTIC ACID, ED - Abnormal; Notable for the following components:   Lactic Acid, Venous 2.0 (*)    All other components within normal limits  CBG MONITORING, ED - Abnormal; Notable for the following components:   Glucose-Capillary 226 (*)    All other components within normal limits     ECG: Ordered     The recent clinical data is shown below. Vitals:   06/04/23 1600 06/04/23 2007 06/04/23 2139 06/04/23 2306  BP: 131/73 128/72  (!) 138/59  Pulse: (!) 103 98  84  Resp: 18 18    Temp: 98.8 F (37.1 C) 98.6 F (37 C)    TempSrc: Oral Oral    SpO2: 97% 99%    Weight:   102.5 kg   Height:   5\' 5"  (1.651 m)     WBC     Component Value Date/Time   WBC 10.6 (H) 06/04/2023 1614   LYMPHSABS 2.3 09/28/2021 1026   MONOABS 0.6 09/28/2021 1026   EOSABS 0.2 09/28/2021 1026   BASOSABS 0.1 09/28/2021 1026    Lactic Acid, Venous    Component Value Date/Time   LATICACIDVEN 2.0 (HH) 06/04/2023 2041    Procalcitonin   Ordered      UA  ordered   Urine analysis:    Component Value Date/Time   COLORURINE YELLOW 09/28/2021 1027   APPEARANCEUR CLEAR 09/28/2021 1027   LABSPEC 1.022 09/28/2021 1027   PHURINE 5.0 09/28/2021 1027   GLUCOSEU >=500 (A) 09/28/2021 1027   HGBUR NEGATIVE 09/28/2021 1027   BILIRUBINUR NEGATIVE 09/28/2021 1027   KETONESUR NEGATIVE 09/28/2021 1027   PROTEINUR 100 (A) 09/28/2021 1027   NITRITE NEGATIVE 09/28/2021 1027   LEUKOCYTESUR NEGATIVE 09/28/2021 1027  Results for orders placed or performed during the hospital encounter of 10/06/17  Blood culture (routine x 2)     Status: None   Collection Time: 10/06/17 11:32 PM   Specimen: BLOOD  Result Value Ref Range Status   Specimen Description BLOOD RIGHT ANTECUBITAL  Final   Special Requests    Final    BOTTLES DRAWN AEROBIC AND ANAEROBIC Blood Culture adequate volume   Culture NO GROWTH 5 DAYS  Final   Report Status 10/12/2017 FINAL  Final  Blood culture (routine x 2)     Status: None   Collection Time: 10/07/17 12:11 AM   Specimen: BLOOD RIGHT HAND  Result Value Ref Range Status   Specimen Description BLOOD RIGHT HAND  Final   Special Requests   Final    BOTTLES DRAWN AEROBIC AND ANAEROBIC Blood Culture adequate volume   Culture NO GROWTH 5 DAYS  Final   Report Status 10/12/2017 FINAL  Final    ABX started Antibiotics Given (last 72 hours)     Date/Time Action Medication Dose Rate   06/04/23 2247 New Bag/Given   ceFAZolin (ANCEF) IVPB 2g/100 mL premix 2 g 200 mL/hr      ____________________________________________________ Recent Labs  Lab 06/04/23 1614  NA 136  K 3.7  CO2 26  GLUCOSE 284*  BUN 12  CREATININE 0.84  CALCIUM 8.8*    Cr  stable,   Lab Results  Component Value Date   CREATININE 0.84 06/04/2023   CREATININE 0.62 09/28/2021   CREATININE 0.65 10/08/2017    Recent Labs  Lab 06/04/23 1614  AST 31  ALT 36  ALKPHOS 31*  BILITOT 0.7  PROT 7.2  ALBUMIN 3.6   Lab Results  Component Value Date   CALCIUM 8.8 (L) 06/04/2023    Plt: Lab Results  Component Value Date   PLT 275 06/04/2023    Recent Labs  Lab 06/04/23 1614  WBC 10.6*  HGB 11.8*  HCT 37.1  MCV 81.9  PLT 275    HG/HCT  stable,       Component Value Date/Time   HGB 11.8 (L) 06/04/2023 1614   HCT 37.1 06/04/2023 1614   MCV 81.9 06/04/2023 1614     _______________________________________________ Hospitalist was called for admission for   Cellulitis, neck    The following Work up has been ordered so far:  Orders Placed This Encounter  Procedures   CT Soft Tissue Neck W Contrast   CBC   Comprehensive metabolic panel   CK   Magnesium   Phosphorus   TSH   Prealbumin   Procalcitonin   Hemoglobin A1c   Cardiac Monitoring Continuous x 12 hours Indications  for use: Other; other indications for use: lightheaded   Apply Diabetes Mellitus Care Plan   STAT CBG when hypoglycemia is suspected. If treated, recheck every 15 minutes after each treatment until CBG >/= 70 mg/dl   Refer to Hypoglycemia Protocol Sidebar Report for treatment of CBG < 70 mg/dl   Consult for Unassigned Medical Admission   I-Stat CG4 Lactic Acid   CBG monitoring, ED   I-Stat CG4 Lactic Acid   Place in observation (patient's expected length of stay will be less than 2 midnights)     OTHER Significant initial  Findings:  labs showing:     DM  labs:  HbA1C: No results for input(s): "HGBA1C" in the last 8760 hours.     CBG (last 3)  Recent Labs    06/04/23 2304  GLUCAP 226*  Cultures:    Component Value Date/Time   SDES BLOOD RIGHT HAND 10/07/2017 0011   SPECREQUEST  10/07/2017 0011    BOTTLES DRAWN AEROBIC AND ANAEROBIC Blood Culture adequate volume   CULT NO GROWTH 5 DAYS 10/07/2017 0011   REPTSTATUS 10/12/2017 FINAL 10/07/2017 0011     Radiological Exams on Admission: CT Soft Tissue Neck W Contrast  Result Date: 06/04/2023 CLINICAL DATA:  Poorly healing abscess of the neck EXAM: CT NECK WITH CONTRAST TECHNIQUE: Multidetector CT imaging of the neck was performed using the standard protocol following the bolus administration of intravenous contrast. RADIATION DOSE REDUCTION: This exam was performed according to the departmental dose-optimization program which includes automated exposure control, adjustment of the mA and/or kV according to patient size and/or use of iterative reconstruction technique. CONTRAST:  75mL OMNIPAQUE IOHEXOL 350 MG/ML SOLN COMPARISON:  None Available. FINDINGS: Pharynx and larynx: Normal Salivary glands: Normal Thyroid: Enlarged and heterogeneous thyroid gland has been previously evaluated with ultrasound. Lymph nodes: No enlarged or abnormal density lymph nodes. Vascular: Negative Limited intracranial: Normal Visualized orbits: Normal  Mastoids and visualized paranasal sinuses: Clear Skeleton: Negative Upper chest: Clear Other: No abscess or fluid collection within the soft tissues at the posterior base of the neck IMPRESSION: 1. No abscess or fluid collection within the soft tissues at the posterior base of the neck. 2. Enlarged and heterogeneous thyroid gland has been previously evaluated with ultrasound. These results were called by telephone at the time of interpretation on 06/04/2023 at 10:21 pm to Dr. Andria Meuse, who verbally acknowledged these results. Electronically Signed   By: Deatra Robinson M.D.   On: 06/04/2023 22:21   _______________________________________________________________________________________________________ Latest  Blood pressure (!) 138/59, pulse 84, temperature 98.6 F (37 C), temperature source Oral, resp. rate 18, height 5\' 5"  (1.651 m), weight 102.5 kg, SpO2 99%.   Vitals  labs and radiology finding personally reviewed  Review of Systems:    Pertinent positives include:  hyperglycemia  Constitutional:  No weight loss, night sweats, Fevers, chills, fatigue, weight loss  HEENT:  No headaches, Difficulty swallowing,Tooth/dental problems,Sore throat,  No sneezing, itching, ear ache, nasal congestion, post nasal drip,  Cardio-vascular:  No chest pain, Orthopnea, PND, anasarca, dizziness, palpitations.no Bilateral lower extremity swelling  GI:  No heartburn, indigestion, abdominal pain, nausea, vomiting, diarrhea, change in bowel habits, loss of appetite, melena, blood in stool, hematemesis Resp:  no shortness of breath at rest. No dyspnea on exertion, No excess mucus, no productive cough, No non-productive cough, No coughing up of blood.No change in color of mucus.No wheezing. Skin:  no rash or lesions. No jaundice GU:  no dysuria, change in color of urine, no urgency or frequency. No straining to urinate.  No flank pain.  Musculoskeletal:  No joint pain or no joint swelling. No decreased range of  motion. No back pain.  Psych:  No change in mood or affect. No depression or anxiety. No memory loss.  Neuro: no localizing neurological complaints, no tingling, no weakness, no double vision, no gait abnormality, no slurred speech, no confusion  All systems reviewed and apart from HOPI all are negative _______________________________________________________________________________________________ Past Medical History:   Past Medical History:  Diagnosis Date   Abdominal pain 09/2017   Diabetes mellitus without complication (HCC)    DKA, type 2 (HCC) 09/2017   Pancreatitis       Past Surgical History:  Procedure Laterality Date   CESAREAN SECTION     WISDOM TOOTH EXTRACTION      Social History:  Ambulatory   independently       reports that she has never smoked. She has never used smokeless tobacco. She reports that she does not drink alcohol and does not use drugs.    Family History:  Family History  Problem Relation Age of Onset   Hypertension Mother    Diabetes Mother    Heart failure Mother    Diabetes Father    Hypertension Father    Transient ischemic attack Father    High Cholesterol Father    Hypertension Other    ______________________________________________________________________________________________ Allergies: No Known Allergies   Prior to Admission medications   Medication Sig Start Date End Date Taking? Authorizing Provider  fenofibrate (TRICOR) 145 MG tablet Take 1 tablet (145 mg total) by mouth daily. 10/08/17 10/08/18  Rhetta Mura, MD  insulin lispro protamine-lispro (HUMALOG 50/50 MIX) (50-50) 100 UNIT/ML SUSP injection Inject 0.08 mLs (8 Units total) into the skin 2 (two) times daily before a meal. 10/08/17   Rhetta Mura, MD  metFORMIN (GLUCOPHAGE) 500 MG tablet Take 1,000 mg by mouth 2 (two) times daily with a meal.     [provider]     ___________________________________________________________________________________________________ Physical Exam:    06/04/2023   11:06 PM 06/04/2023    9:39 PM 06/04/2023    8:07 PM  Vitals with BMI  Height  5\' 5"    Weight  226 lbs   BMI  37.6   Systolic 138  128  Diastolic 59  72  Pulse 84  98     1. General:  in No  Acute distress    Chronically ill-appearing 2. Psychological: Alert and   Oriented 3. Head/ENT:    Dry Mucous Membranes                          Head Non traumatic, neck supple                          Normal  Dentition 4. SKIN:  decreased Skin turgor,  Skin clean Dry redness of the back of the neck with area of break down    5. Heart: Regular rate and rhythm no  Murmur, no Rub or gallop 6. Lungs: no wheezes or crackles   7. Abdomen: Soft,  non-tender, Non distended   obese  bowel sounds present 8. Lower extremities: no clubbing, cyanosis, no  edema 9. Neurologically Grossly intact, moving all 4 extremities equally  10. MSK: Normal range of motion    Chart has been reviewed  ______________________________________________________________________________________________  Assessment/Plan 42 y.o. female with medical history significant of DM2, HLD, HTN  Admitted for   Cellulitis, neck     Present on Admission:  Cellulitis  DM (diabetes mellitus), type 2 with complications (HCC)  Hyperlipidemia  Essential hypertension  Elevated lactic acid level    Cellulitis -admit per  cellulitis protocol will      Add vancomycin given purulent discharge,       continue rocephin vanc      plain films showed:   no evidence of air  no evidence of  abscess   no               foreign   objects        Will obtain MRSA screening,           further antibiotic adjustment pending above results   DM (diabetes mellitus), type 2 with complications (HCC)  -  Order Sensitive SSI   - continue home insulin but decreased to  NPH 15 units BID  -  check TSH and HgA1C  -  Hold by mouth medications    Hyperlipidemia Continue home medications fenofibrate and Vascepa she also states she is taking  Lipitor Patient history of severe triglyceridemia in the past we will repeat lipid panel  Elevated lactic acid level Rehydrate and recheck Possibly in the setting of dehydration patient has not taken any p.o. for the rest of the day   Other plan as per orders.  DVT prophylaxis:  SCD   Code Status:    Code Status: Prior FULL CODE as per patient   I had personally discussed CODE STATUS with patient   ACP   none   Family Communication:   Family not at  Bedside   Diet diabetic   Disposition Plan:      To home once workup is complete and patient is stable   Following barriers for discharge:                                                         Pain controlled with PO medications                              able to transition to PO antibiotics                             Will need to be able to tolerate PO                                                 Diabetes care coordinator                     Consults called: none    Admission status:  ED Disposition     ED Disposition  Admit   Condition  --   Comment  Hospital Area: [facility] [[id number]]  Level of Care: Telemetry Medical [[id number]]  May place patient in observation at [facility] or [facility] if equivalent level of care is available:: No  Covid Evaluation: Asymptomatic - no recent exposure (last 10 days) testing not required  Diagnosis: Cellulitis [[id number]]  Admitting Physician: Therisa Doyne [[id number]]  Attending Physician: Therisa Doyne [[id number]]          Obs    Level of care     tele  For 12H      very high risk for false native test result   Peyton Rossner 06/04/2023, 11:53 PM    [facility]     after 2 AM please page floor coverage PA If 7AM-7PM, please contact the day team taking care of the patient using [email or web address]

## 2023-06-04 NOTE — Assessment & Plan Note (Signed)
Rehydrate and recheck Possibly in the setting of dehydration patient has not taken any p.o. for the rest of the day

## 2023-06-04 NOTE — Progress Notes (Signed)
ED Pharmacy Antibiotic Sign Off An antibiotic consult was received from an ED provider for vancomycin per pharmacy dosing for cellulitis. A chart review was completed to assess appropriateness.   The following one time order(s) were placed:  Vancomycin 2g IV x 1  Further antibiotic and/or antibiotic pharmacy consults should be ordered by the admitting provider if indicated.   Thank you for allowing pharmacy to be a part of this patient's care.   Daylene Posey, [facility]  Clinical Pharmacist 06/04/23 11:08 PM

## 2023-06-05 DIAGNOSIS — E118 Type 2 diabetes mellitus with unspecified complications: Secondary | ICD-10-CM | POA: Diagnosis not present

## 2023-06-05 DIAGNOSIS — Z8249 Family history of ischemic heart disease and other diseases of the circulatory system: Secondary | ICD-10-CM | POA: Diagnosis not present

## 2023-06-05 DIAGNOSIS — E872 Acidosis, unspecified: Secondary | ICD-10-CM | POA: Diagnosis present

## 2023-06-05 DIAGNOSIS — Z6837 Body mass index (BMI) 37.0-37.9, adult: Secondary | ICD-10-CM | POA: Diagnosis not present

## 2023-06-05 DIAGNOSIS — W1839XA Other fall on same level, initial encounter: Secondary | ICD-10-CM | POA: Diagnosis present

## 2023-06-05 DIAGNOSIS — E785 Hyperlipidemia, unspecified: Secondary | ICD-10-CM | POA: Diagnosis present

## 2023-06-05 DIAGNOSIS — Z794 Long term (current) use of insulin: Secondary | ICD-10-CM | POA: Diagnosis not present

## 2023-06-05 DIAGNOSIS — Z83438 Family history of other disorder of lipoprotein metabolism and other lipidemia: Secondary | ICD-10-CM | POA: Diagnosis not present

## 2023-06-05 DIAGNOSIS — L0211 Cutaneous abscess of neck: Secondary | ICD-10-CM | POA: Diagnosis present

## 2023-06-05 DIAGNOSIS — R7989 Other specified abnormal findings of blood chemistry: Secondary | ICD-10-CM | POA: Diagnosis not present

## 2023-06-05 DIAGNOSIS — E876 Hypokalemia: Secondary | ICD-10-CM | POA: Diagnosis present

## 2023-06-05 DIAGNOSIS — R000 Tachycardia, unspecified: Secondary | ICD-10-CM | POA: Diagnosis present

## 2023-06-05 DIAGNOSIS — I10 Essential (primary) hypertension: Secondary | ICD-10-CM | POA: Diagnosis present

## 2023-06-05 DIAGNOSIS — D649 Anemia, unspecified: Secondary | ICD-10-CM | POA: Diagnosis present

## 2023-06-05 DIAGNOSIS — Z833 Family history of diabetes mellitus: Secondary | ICD-10-CM | POA: Diagnosis not present

## 2023-06-05 DIAGNOSIS — E1165 Type 2 diabetes mellitus with hyperglycemia: Secondary | ICD-10-CM | POA: Diagnosis present

## 2023-06-05 DIAGNOSIS — Z79899 Other long term (current) drug therapy: Secondary | ICD-10-CM | POA: Diagnosis not present

## 2023-06-05 DIAGNOSIS — Z7984 Long term (current) use of oral hypoglycemic drugs: Secondary | ICD-10-CM | POA: Diagnosis not present

## 2023-06-05 DIAGNOSIS — L03221 Cellulitis of neck: Secondary | ICD-10-CM | POA: Diagnosis present

## 2023-06-05 DIAGNOSIS — Z9641 Presence of insulin pump (external) (internal): Secondary | ICD-10-CM | POA: Diagnosis present

## 2023-06-05 LAB — CBC
HCT: 33.8 % — ABNORMAL LOW (ref 36.0–46.0)
Hemoglobin: 11 g/dL — ABNORMAL LOW (ref 12.0–15.0)
MCH: 27 pg (ref 26.0–34.0)
MCHC: 32.5 g/dL (ref 30.0–36.0)
MCV: 82.8 fL (ref 80.0–100.0)
Platelets: 256 10*3/uL (ref 150–400)
RBC: 4.08 MIL/uL (ref 3.87–5.11)
RDW: 13.2 % (ref 11.5–15.5)
WBC: 10.2 10*3/uL (ref 4.0–10.5)
nRBC: 0 % (ref 0.0–0.2)

## 2023-06-05 LAB — COMPREHENSIVE METABOLIC PANEL
ALT: 34 U/L (ref 0–44)
AST: 29 U/L (ref 15–41)
Albumin: 3.5 g/dL (ref 3.5–5.0)
Alkaline Phosphatase: 30 U/L — ABNORMAL LOW (ref 38–126)
Anion gap: 10 (ref 5–15)
BUN: 13 mg/dL (ref 6–20)
CO2: 26 mmol/L (ref 22–32)
Calcium: 9 mg/dL (ref 8.9–10.3)
Chloride: 100 mmol/L (ref 98–111)
Creatinine, Ser: 0.74 mg/dL (ref 0.44–1.00)
GFR, Estimated: >60 mL/min (ref >60)
Glucose, Bld: 270 mg/dL — ABNORMAL HIGH (ref 70–99)
Potassium: 3.8 mmol/L (ref 3.5–5.1)
Sodium: 136 mmol/L (ref 135–145)
Total Bilirubin: 0.6 mg/dL (ref 0.3–1.2)
Total Protein: 6.4 g/dL — ABNORMAL LOW (ref 6.5–8.1)

## 2023-06-05 LAB — GLUCOSE, CAPILLARY
Glucose-Capillary: 220 mg/dL — ABNORMAL HIGH (ref 70–99)
Glucose-Capillary: 241 mg/dL — ABNORMAL HIGH (ref 70–99)
Glucose-Capillary: 264 mg/dL — ABNORMAL HIGH (ref 70–99)
Glucose-Capillary: 298 mg/dL — ABNORMAL HIGH (ref 70–99)
Glucose-Capillary: 321 mg/dL — ABNORMAL HIGH (ref 70–99)
Glucose-Capillary: 354 mg/dL — ABNORMAL HIGH (ref 70–99)
Glucose-Capillary: 361 mg/dL — ABNORMAL HIGH (ref 70–99)

## 2023-06-05 LAB — HIV ANTIBODY (ROUTINE TESTING W REFLEX): HIV Screen 4th Generation wRfx: NONREACTIVE

## 2023-06-05 LAB — PHOSPHORUS
Phosphorus: 3.8 mg/dL (ref 2.5–4.6)
Phosphorus: 3.8 mg/dL (ref 2.5–4.6)

## 2023-06-05 LAB — CBG MONITORING, ED: Glucose-Capillary: 253 mg/dL — ABNORMAL HIGH (ref 70–99)

## 2023-06-05 LAB — I-STAT CG4 LACTIC ACID, ED: Lactic Acid, Venous: 1.6 mmol/L (ref 0.5–1.9)

## 2023-06-05 LAB — MAGNESIUM
Magnesium: 1.7 mg/dL (ref 1.7–2.4)
Magnesium: 1.7 mg/dL (ref 1.7–2.4)

## 2023-06-05 LAB — LIPID PANEL
Cholesterol: 155 mg/dL (ref 0–200)
HDL: 28 mg/dL — ABNORMAL LOW (ref >40)
LDL Cholesterol: 100 mg/dL — ABNORMAL HIGH (ref 0–99)
Total CHOL/HDL Ratio: 5.5 ratio
Triglycerides: 136 mg/dL (ref <150)
VLDL: 27 mg/dL (ref 0–40)

## 2023-06-05 LAB — TSH: TSH: 0.704 u[IU]/mL (ref 0.350–4.500)

## 2023-06-05 LAB — PREALBUMIN: Prealbumin: 20 mg/dL (ref 18–38)

## 2023-06-05 LAB — CK: Total CK: 198 U/L (ref 38–234)

## 2023-06-05 LAB — PROCALCITONIN: Procalcitonin: <0.1 ng/mL

## 2023-06-05 MED ORDER — ACETAMINOPHEN 325 MG PO TABS
650.0000 mg | ORAL_TABLET | Freq: Four times a day (QID) | ORAL | Status: DC | PRN
  Administered 2023-06-05 – 2023-06-06 (×2): 650 mg via ORAL
  Filled 2023-06-05 (×2): qty 2

## 2023-06-05 MED ORDER — ACETAMINOPHEN 650 MG RE SUPP: 650.0000 mg | Freq: Four times a day (QID) | RECTAL | Status: DC | PRN

## 2023-06-05 MED ORDER — FENOFIBRATE 160 MG PO TABS
160.0000 mg | ORAL_TABLET | Freq: Every day | ORAL | Status: DC
  Administered 2023-06-05 – 2023-06-07 (×3): 160 mg via ORAL
  Filled 2023-06-05 (×3): qty 1

## 2023-06-05 MED ORDER — INSULIN NPH (HUMAN) (ISOPHANE) 100 UNIT/ML ~~LOC~~ SUSP
15.0000 [IU] | Freq: Two times a day (BID) | SUBCUTANEOUS | Status: DC
  Administered 2023-06-05 – 2023-06-06 (×3): 15 [IU] via SUBCUTANEOUS
  Filled 2023-06-05: qty 10

## 2023-06-05 MED ORDER — VANCOMYCIN HCL 1250 MG/250ML IV SOLN: 1250.0000 mg | Freq: Two times a day (BID) | INTRAVENOUS | Status: DC

## 2023-06-05 MED ORDER — INSULIN REGULAR HUMAN 100 UNIT/ML IJ SOLN
15.0000 [IU] | Freq: Two times a day (BID) | INTRAMUSCULAR | Status: DC
  Filled 2023-06-05: qty 3

## 2023-06-05 MED ORDER — ONDANSETRON HCL 4 MG PO TABS: 4.0000 mg | ORAL_TABLET | Freq: Four times a day (QID) | ORAL | Status: DC | PRN

## 2023-06-05 MED ORDER — HYDROCODONE-ACETAMINOPHEN 5-325 MG PO TABS
1.0000 | ORAL_TABLET | ORAL | Status: DC | PRN
  Filled 2023-06-05: qty 2

## 2023-06-05 MED ORDER — SODIUM CHLORIDE 0.9 % IV SOLN: INTRAVENOUS | Status: AC

## 2023-06-05 MED ORDER — ONDANSETRON HCL 4 MG/2ML IJ SOLN: 4.0000 mg | Freq: Four times a day (QID) | INTRAMUSCULAR | Status: DC | PRN

## 2023-06-05 MED ORDER — VANCOMYCIN HCL 1750 MG/350ML IV SOLN
1750.0000 mg | INTRAVENOUS | Status: DC
  Administered 2023-06-05: 1750 mg via INTRAVENOUS
  Filled 2023-06-05 (×2): qty 350

## 2023-06-05 MED ORDER — LISINOPRIL 5 MG PO TABS
5.0000 mg | ORAL_TABLET | Freq: Every day | ORAL | Status: DC
  Administered 2023-06-05 – 2023-06-07 (×3): 5 mg via ORAL
  Filled 2023-06-05 (×3): qty 1

## 2023-06-05 NOTE — Hospital Course (Addendum)
Laurie Rubio is a 42 y.o. female with medical history significant of diabetes mellitus type 2, hypertension, hyperlipidemia presented to the hospital with swelling and redness and induration of the back of her neck.  She was seen by primary care provider who attempted to drain but nothing came out.  Patient is on insulin pump at home but had ran out of insulin on the day of presentation.  She has an appointment follow-up with endocrinology in the future.  On the day of presentation patient stated that when she tried to stand she fell forward hitting her head without any loss of consciousness. In the ED, patient was mildly tachycardic but no fevers.  Labs showed WBC of 10.6 with lactate of 2.0.  CT soft tissue neck with contrast showed no abscess or fluid collection.  Was given vancomycin morphine and was admitted hospital for further evaluation treatment.   Assessment and plan.  Cellulitis of the neck.  Had purulent discharge on presentation, will continue on vancomycin and Rocephin.  X-ray of the neck without any evidence of air.  CT scan of the soft tissue neck with contrast without any abscess.  Had elevated lactate.  Procalcitonin less than 0.1.  Latest WBC at 10.2 from 10.6.  Temperature max of 98.8 F.  Hyperlipidemia.  Continue statins.  Essential hypertension.  Continue lisinopril.  Diabetes mellitus type 2.  On insulin  as outpatient.  Continue sliding scale insulin, NPH twice daily.  Chronic anemia.  Continue to monitor CBC.

## 2023-06-05 NOTE — Plan of Care (Signed)

## 2023-06-05 NOTE — Progress Notes (Signed)
Pharmacy Antibiotic Note  Laurie Rubio is a 42 y.o. female presented on 06/04/2023 with abscess from providers office with concern for cellulitis. Failed outpatient therapy with augmentin x 7 days and clindamycin x 7 days. S/p outpt I/D 9/2. CT of the neck repeated on 9/11 and showed no abscess or fluid collection. Pharmacy has been consulted for vancomycin dosing.  Patient's Scr is stable, afeb, and WBC are trending down.   Plan: Adjust Vancomycin 1750 mg q 24 (eAUC 478.1, Scr 0.8, Vd 0.5) Continue Ceftriaxone 2g q24 x 7 days per MD  F/u renal function, length of therapy and vancomycin levels as needed    Height: 5\' 5"  (165.1 cm) Weight: 102.5 kg (225 lb 15.5 oz) IBW/kg (Calculated) : 57  Temp (24hrs), Avg:98.2 F (36.8 C), Min:97.5 F (36.4 C), Max:98.8 F (37.1 C)  Recent Labs  Lab 06/04/23 1614 06/04/23 2041 06/05/23 0004 06/05/23 0532  WBC 10.6*  --   --  10.2  CREATININE 0.84  --   --  0.74  LATICACIDVEN  --  2.0* 1.6  --     Estimated Creatinine Clearance: 106.5 mL/min (by C-G formula based on SCr of 0.74 mg/dL).    No Known Allergies  Antimicrobials this admission: Ceftriaxone 9/12 >> 9/18 Vanco 9/11 Cefazolin 9/11 x1   Dose adjustments this admission: N/a  Microbiology results: 9/11 MRSA PCR: pending  Thank you for allowing pharmacy to be a part of this patient's care.  Merla Riches 06/05/2023 8:56 AM

## 2023-06-05 NOTE — ED Notes (Signed)
ED TO INPATIENT HANDOFF REPORT  ED Nurse Name and Phone #: Rodney Booze [phone]  S Name/Age/Gender Laurie Rubio 42 y.o. female Room/Bed: H018C/H018C  Code Status   Code Status: Full Code  Home/SNF/Other Home Patient oriented to: self, place, time, and situation Is this baseline? Yes   Triage Complete: Triage complete  Chief Complaint Cellulitis [L03.90]  Triage Note Pt here from Md office for imaging of her neck for and abscess  that has not been healing , pt has been on two rounds of antibiotics without relief ,    Allergies No Known Allergies  Level of Care/Admitting Diagnosis ED Disposition     ED Disposition  Admit   Condition  --   Comment  Hospital Area: [facility] [[id number]]  Level of Care: Telemetry Medical [104]  May place patient in observation at [facility] or [facility] if equivalent level of care is available:: No  Covid Evaluation: Asymptomatic - no recent exposure (last 10 days) testing not required  Diagnosis: Cellulitis [952841]  Admitting Physician: Therisa Doyne [[id number]]  Attending Physician: Therisa Doyne [[id number]]          B Medical/Surgery History Past Medical History:  Diagnosis Date   Abdominal pain 09/2017   Diabetes mellitus without complication (HCC)    DKA, type 2 (HCC) 09/2017   Pancreatitis    Past Surgical History:  Procedure Laterality Date   CESAREAN SECTION     WISDOM TOOTH EXTRACTION       A IV Location/Drains/Wounds Patient Lines/Drains/Airways Status     Active Line/Drains/Airways     Name Placement date Placement time Site Days   Peripheral IV 06/04/23 20 G 1" Anterior;Left;Proximal Forearm 06/04/23  2007  Forearm  1            Intake/Output Last 24 hours  Intake/Output Summary (Last 24 hours) at 06/05/2023 0014 Last data filed at 06/04/2023 2317 Gross per 24 hour  Intake 100 ml  Output --  Net 100 ml    Labs/Imaging Results for orders placed or performed during the  hospital encounter of 06/04/23 (from the past 48 hour(s))  CBC     Status: Abnormal   Collection Time: 06/04/23  4:14 PM  Result Value Ref Range   WBC 10.6 (H) 4.0 - 10.5 K/uL   RBC 4.53 3.87 - 5.11 MIL/uL   Hemoglobin 11.8 (L) 12.0 - 15.0 g/dL   HCT 32.4 40.1 - 02.7 %   MCV 81.9 80.0 - 100.0 fL   MCH 26.0 26.0 - 34.0 pg   MCHC 31.8 30.0 - 36.0 g/dL   RDW 25.3 66.4 - 40.3 %   Platelets 275 150 - 400 K/uL   nRBC 0.0 0.0 - 0.2 %    Comment: Performed at [facility], [address], [place], [place] [address]  Comprehensive metabolic panel     Status: Abnormal   Collection Time: 06/04/23  4:14 PM  Result Value Ref Range   Sodium 136 135 - 145 mmol/L   Potassium 3.7 3.5 - 5.1 mmol/L   Chloride 101 98 - 111 mmol/L   CO2 26 22 - 32 mmol/L   Glucose, Bld 284 (H) 70 - 99 mg/dL    Comment: Glucose reference range applies only to samples taken after fasting for at least 8 hours.   BUN 12 6 - 20 mg/dL   Creatinine, Ser 9.56 0.44 - 1.00 mg/dL   Calcium 8.8 (L) 8.9 - 10.3 mg/dL   Total Protein 7.2 6.5 -  8.1 g/dL   Albumin 3.6 3.5 - 5.0 g/dL   AST 31 15 - 41 U/L   ALT 36 0 - 44 U/L   Alkaline Phosphatase 31 (L) 38 - 126 U/L   Total Bilirubin 0.7 0.3 - 1.2 mg/dL   GFR, Estimated >40 >98 mL/min    Comment: (NOTE) Calculated using the CKD-EPI Creatinine Equation (2021)    Anion gap 9 5 - 15    Comment: Performed at [facility], [address] [place], [place] [address]  I-Stat CG4 Lactic Acid     Status: Abnormal   Collection Time: 06/04/23  8:41 PM  Result Value Ref Range   Lactic Acid, Venous 2.0 (HH) 0.5 - 1.9 mmol/L   Comment NOTIFIED PHYSICIAN   CBG monitoring, ED     Status: Abnormal   Collection Time: 06/04/23 11:04 PM  Result Value Ref Range   Glucose-Capillary 226 (H) 70 - 99 mg/dL    Comment: Glucose reference range applies only to samples taken after fasting for at least 8 hours.  CBG monitoring, ED     Status: Abnormal   Collection Time: 06/05/23 12:00 AM   Result Value Ref Range   Glucose-Capillary 253 (H) 70 - 99 mg/dL    Comment: Glucose reference range applies only to samples taken after fasting for at least 8 hours.  I-Stat CG4 Lactic Acid     Status: None   Collection Time: 06/05/23 12:04 AM  Result Value Ref Range   Lactic Acid, Venous 1.6 0.5 - 1.9 mmol/L   CT Soft Tissue Neck W Contrast  Result Date: 06/04/2023 CLINICAL DATA:  Poorly healing abscess of the neck EXAM: CT NECK WITH CONTRAST TECHNIQUE: Multidetector CT imaging of the neck was performed using the standard protocol following the bolus administration of intravenous contrast. RADIATION DOSE REDUCTION: This exam was performed according to the departmental dose-optimization program which includes automated exposure control, adjustment of the mA and/or kV according to patient size and/or use of iterative reconstruction technique. CONTRAST:  75mL OMNIPAQUE IOHEXOL 350 MG/ML SOLN COMPARISON:  None Available. FINDINGS: Pharynx and larynx: Normal Salivary glands: Normal Thyroid: Enlarged and heterogeneous thyroid gland has been previously evaluated with ultrasound. Lymph nodes: No enlarged or abnormal density lymph nodes. Vascular: Negative Limited intracranial: Normal Visualized orbits: Normal Mastoids and visualized paranasal sinuses: Clear Skeleton: Negative Upper chest: Clear Other: No abscess or fluid collection within the soft tissues at the posterior base of the neck IMPRESSION: 1. No abscess or fluid collection within the soft tissues at the posterior base of the neck. 2. Enlarged and heterogeneous thyroid gland has been previously evaluated with ultrasound. These results were called by telephone at the time of interpretation on 06/04/2023 at 10:21 pm to Dr. Andria Meuse, who verbally acknowledged these results. Electronically Signed   By: Deatra Robinson M.D.   On: 06/04/2023 22:21    Pending Labs Unresulted Labs (From admission, onward)     Start     Ordered   06/05/23 0500  Prealbumin   Tomorrow morning,   R        06/04/23 2311   06/05/23 0500  Lipid panel  Tomorrow morning,   R        06/04/23 2351   06/04/23 2345  MRSA Next Gen by PCR, Nasal  Once,   R        06/04/23 2345   06/04/23 2312  CK  Add-on,   AD        06/04/23 2311  06/04/23 2312  Magnesium  Add-on,   AD        06/04/23 2311   06/04/23 2312  Phosphorus  Add-on,   AD        06/04/23 2311   06/04/23 2312  TSH  Add-on,   AD        06/04/23 2311   06/04/23 2312  Procalcitonin  Add-on,   AD       References:    Procalcitonin Lower Respiratory Tract Infection AND Sepsis Procalcitonin Algorithm   06/04/23 2311   06/04/23 2312  Hemoglobin A1c  Add-on,   AD       Comments: To assess prior glycemic control    06/04/23 2311   Signed and Held  HIV Antibody (routine testing w rflx)  (HIV Antibody (Routine testing w reflex) panel)  Once,   R        Signed and Held   Signed and Held  Magnesium  Tomorrow morning,   R        Signed and Held   Signed and Held  Phosphorus  Tomorrow morning,   R        Signed and Held   Signed and Held  Comprehensive metabolic panel  Tomorrow morning,   R       Question:  Release to patient  Answer:  Immediate   Signed and Held   Signed and Held  CBC  Tomorrow morning,   R       Question:  Release to patient  Answer:  Immediate   Signed and Held            Vitals/Pain Today's Vitals   06/04/23 2139 06/04/23 2306 06/04/23 2344 06/05/23 0001  BP:  (!) 138/59    Pulse:  84    Resp:      Temp:    (!) 97.5 F (36.4 C)  TempSrc:    Oral  SpO2:      Weight: 102.5 kg     Height: 5\' 5"  (1.651 m)     PainSc:   2      Isolation Precautions No active isolations  Medications Medications  vancomycin (VANCOREADY) IVPB 2000 mg/400 mL (2,000 mg Intravenous New Bag/Given 06/04/23 2341)  insulin aspart (novoLOG) injection 0-9 Units (3 Units Subcutaneous Given 06/04/23 2322)  cefTRIAXone (ROCEPHIN) 2 g in sodium chloride 0.9 % 100 mL IVPB (has no administration in time range)   iohexol (OMNIPAQUE) 350 MG/ML injection 75 mL (75 mLs Intravenous Contrast Given 06/04/23 2052)  morphine (PF) 4 MG/ML injection 4 mg (4 mg Intravenous Given 06/04/23 2206)  ceFAZolin (ANCEF) IVPB 2g/100 mL premix (0 g Intravenous Stopped 06/04/23 2317)  ondansetron (ZOFRAN) injection 4 mg (4 mg Intravenous Given 06/04/23 2245)    Mobility walks     Focused Assessments Cardiac Assessment Handoff:    Lab Results  Component Value Date   TROPONINI <0.03 10/07/2017   No results found for: "DDIMER" Does the Patient currently have chest pain? No    R Recommendations: See Admitting Provider Note  Report given to:   Additional Notes:

## 2023-06-05 NOTE — ED Notes (Signed)
Called 5N

## 2023-06-05 NOTE — Inpatient Diabetes Management (Signed)
Inpatient Diabetes Program Recommendations  AACE/ADA: New Consensus Statement on Inpatient Glycemic Control (2015)  Target Ranges:  Prepandial:   less than 140 mg/dL      Peak postprandial:   less than 180 mg/dL (1-2 hours)      Critically ill patients:  140 - 180 mg/dL   Lab Results  Component Value Date   GLUCAP 298 (H) 06/05/2023   HGBA1C 12.5 (H) 10/07/2017    Review of Glycemic Control  Latest Reference Range & Units 06/04/23 23:04 06/05/23 00:00 06/05/23 01:35 06/05/23 05:40 06/05/23 07:26 06/05/23 11:24  Glucose-Capillary 70 - 99 mg/dL 161 (H) 096 (H) 045 (H) 241 (H) 220 (H) 298 (H)   Diabetes history: DM type 2 Outpatient Diabetes medications: Concentrated Humulin R U-500 insulin in Omnipod insulin pump in addition to Humulin N 20 units bid Insulin pump settings 12 A-6:30 A      1 unit/hour 6:30 A-12 A      1.9 units/hour  Basal in a 24 hour period    39.75 units Sensitivity 1 unit drops glucose 30 points Carb ratio 1 unit for every 5 grams of carbohydrates  Current orders for Inpatient glycemic control:  Novolin N (NPH) 15 units bid Novolog 0-9 units Q4 hours  Endocrinologist Dr. Talmage Nap  Inpatient Diabetes Program Recommendations:    -   Increase NPH to 20 units bid -   Add Novolog 5 units tid meal coverage if eating >50% of meals  Spoke with pt at bedside regarding her insulin at home and insulin pump. Pt is okay with staying off her insulin pump until discharged. Pt to have an appointment with endocrinology soon. Note pt uses the concentrated Humulin R U-500 insulin pump in addition to NPH bid.  Thanks,  Christena Deem RN, MSN, BC-ADM Inpatient Diabetes Coordinator Team Pager [phone] (8a-5p)

## 2023-06-05 NOTE — Progress Notes (Addendum)
PROGRESS NOTE    Laurie Rubio  BJY:[id number] DOB: [birth year] DOA: 06/04/2023 PCP: Ailene Ravel, MD    Brief Narrative:  Laurie Rubio is a 42 y.o. female with medical history significant of diabetes mellitus type 2, hypertension, hyperlipidemia presented to the hospital with swelling and redness and induration of the back of her neck.  She was seen by primary care provider who attempted to drain but nothing came out.  Patient is on insulin at home but had ran out of insulin on the day of presentation.  She has an appointment follow-up with endocrinology in the future.  On the day of presentation, patient stated that when she tried to stand she fell forward hitting her head without any loss of consciousness. In the ED, patient was mildly tachycardic but no fevers.  Labs showed WBC of 10.6 with lactate of 2.0.  CT soft tissue neck with contrast showed no abscess or fluid collection.  Was given vancomycin morphine and was admitted hospital for further evaluation treatment.  Assessment and plan.  Cellulitis of the neck.  Had purulent discharge on presentation, will continue on vancomycin and Rocephin.  X-ray of the neck without any evidence of air.  CT scan of the soft tissue neck with contrast without any abscess.  Had elevated lactate.  Procalcitonin less than 0.1.  Latest WBC at 10.2 from 10.6.  Temperature max of 98.8 F.  Hyperlipidemia.  Continue statins.  Essential hypertension.  Continue lisinopril.  Diabetes mellitus type 2.  On insulin  as outpatient.  Continue sliding scale insulin, NPH twice daily.  Chronic anemia.  Continue to monitor CBC.     DVT prophylaxis: SCDs Start: 06/05/23 0140   Code Status:     Code Status: Full Code  Disposition: Likely in 1 to 2 days.  Status is: Observation  The patient will require care spanning > 2 midnights and should be moved to inpatient because: Failed outpatient treatment, cellulitis of the neck, need for IV antibiotics,   Family  Communication: None at bedside  Consultants:  Verbally spoke with general surgery who reviewed with picture.  Recommend conservative treatment, no need for debridement at this time unless gets worse or actively draining.  Procedures:  None  Antimicrobials:  Vancomycin Rocephin  Anti-infectives (From admission, onward)    Start     Dose/Rate Route Frequency Ordered Stop   06/05/23 2200  vancomycin (VANCOREADY) IVPB 1750 mg/350 mL        1,750 mg 175 mL/hr over 120 Minutes Intravenous Every 24 hours 06/05/23 0903     06/05/23 1100  vancomycin (VANCOREADY) IVPB 1250 mg/250 mL  Status:  Discontinued        1,250 mg 166.7 mL/hr over 90 Minutes Intravenous Every 12 hours 06/05/23 0048 06/05/23 0835   06/05/23 0000  cefTRIAXone (ROCEPHIN) 2 g in sodium chloride 0.9 % 100 mL IVPB        2 g 200 mL/hr over 30 Minutes Intravenous Every 24 hours 06/04/23 2345 06/11/23 2359   06/04/23 2315  vancomycin (VANCOREADY) IVPB 2000 mg/400 mL        2,000 mg 200 mL/hr over 120 Minutes Intravenous  Once 06/04/23 2308 06/05/23 0141   06/04/23 2230  ceFAZolin (ANCEF) IVPB 2g/100 mL premix        2 g 200 mL/hr over 30 Minutes Intravenous  Once 06/04/23 2218 06/04/23 2317        Subjective: Today, patient was seen and examined at bedside.  Patient complains of pain  discomfort on the neck especially on movement of her neck.  Objective: Vitals:   06/05/23 0001 06/05/23 0138 06/05/23 0411 06/05/23 0728  BP:  (!) 142/76 106/60 112/71  Pulse:  92 75 84  Resp:  18 18 18   Temp: (!) 97.5 F (36.4 C) 98 F (36.7 C) 98.3 F (36.8 C) 97.8 F (36.6 C)  TempSrc: Oral Oral Oral Oral  SpO2:  98% 99% 97%  Weight:      Height:        Intake/Output Summary (Last 24 hours) at 06/05/2023 0939 Last data filed at 06/05/2023 0546 Gross per 24 hour  Intake 541.05 ml  Output --  Net 541.05 ml   Filed Weights   06/04/23 2139  Weight: 102.5 kg    Physical Examination: Body mass index is 37.6 kg/m.   General: Obese built  not in obvious distress HENT:   No scleral pallor or icterus noted. Oral mucosa is moist.  Chest:  Clear breath sounds.  . No crackles or wheezes.  CVS: S1 &S2 heard. No murmur.  Regular rate and rhythm. Abdomen: Soft, nontender, nondistended.  Bowel sounds are heard.   Extremities: No cyanosis, clubbing or edema.  Peripheral pulses are palpable. Psych: Alert, awake and oriented, normal mood CNS:  No cranial nerve deficits.  Power equal in all extremities.   Skin: Warm and dry.  Erythema induration on the lower neck especially at the neck fold with area of ulceration. See picture below on 06/05/2023    Data Reviewed:   CBC: Recent Labs  Lab 06/04/23 1614 06/05/23 0532  WBC 10.6* 10.2  HGB 11.8* 11.0*  HCT 37.1 33.8*  MCV 81.9 82.8  PLT 275 256    Basic Metabolic Panel: Recent Labs  Lab 06/04/23 1614 06/05/23 0430 06/05/23 0532  NA 136  --  136  K 3.7  --  3.8  CL 101  --  100  CO2 26  --  26  GLUCOSE 284*  --  270*  BUN 12  --  13  CREATININE 0.84  --  0.74  CALCIUM 8.8*  --  9.0  MG  --  1.7 1.7  PHOS  --  3.8 3.8    Liver Function Tests: Recent Labs  Lab 06/04/23 1614 06/05/23 0532  AST 31 29  ALT 36 34  ALKPHOS 31* 30*  BILITOT 0.7 0.6  PROT 7.2 6.4*  ALBUMIN 3.6 3.5     Radiology Studies: CT Soft Tissue Neck W Contrast  Result Date: 06/04/2023 CLINICAL DATA:  Poorly healing abscess of the neck EXAM: CT NECK WITH CONTRAST TECHNIQUE: Multidetector CT imaging of the neck was performed using the standard protocol following the bolus administration of intravenous contrast. RADIATION DOSE REDUCTION: This exam was performed according to the departmental dose-optimization program which includes automated exposure control, adjustment of the mA and/or kV according to patient size and/or use of iterative reconstruction technique. CONTRAST:  75mL OMNIPAQUE IOHEXOL 350 MG/ML SOLN COMPARISON:  None Available. FINDINGS: Pharynx and larynx:  Normal Salivary glands: Normal Thyroid: Enlarged and heterogeneous thyroid gland has been previously evaluated with ultrasound. Lymph nodes: No enlarged or abnormal density lymph nodes. Vascular: Negative Limited intracranial: Normal Visualized orbits: Normal Mastoids and visualized paranasal sinuses: Clear Skeleton: Negative Upper chest: Clear Other: No abscess or fluid collection within the soft tissues at the posterior base of the neck IMPRESSION: 1. No abscess or fluid collection within the soft tissues at the posterior base of the neck. 2. Enlarged and heterogeneous thyroid  gland has been previously evaluated with ultrasound. These results were called by telephone at the time of interpretation on 06/04/2023 at 10:21 pm to Dr. Andria Meuse, who verbally acknowledged these results. Electronically Signed   By: Deatra Robinson M.D.   On: 06/04/2023 22:21      LOS: 0 days    Joycelyn Das, MD [facility] Available via Epic secure chat 7am-7pm After these hours, please refer to coverage provider listed on [email or web address] 06/05/2023, 9:39 AM

## 2023-06-05 NOTE — Plan of Care (Signed)
  Problem: Coping: Goal: Ability to adjust to condition or change in health will improve Outcome: Progressing   Problem: Metabolic: Goal: Ability to maintain appropriate glucose levels will improve Outcome: Progressing   Problem: Nutritional: Goal: Maintenance of adequate nutrition will improve Outcome: Progressing Goal: Progress toward achieving an optimal weight will improve Outcome: Progressing   Problem: Skin Integrity: Goal: Risk for impaired skin integrity will decrease Outcome: Progressing

## 2023-06-06 DIAGNOSIS — R7989 Other specified abnormal findings of blood chemistry: Secondary | ICD-10-CM | POA: Diagnosis not present

## 2023-06-06 DIAGNOSIS — E118 Type 2 diabetes mellitus with unspecified complications: Secondary | ICD-10-CM | POA: Diagnosis not present

## 2023-06-06 DIAGNOSIS — I10 Essential (primary) hypertension: Secondary | ICD-10-CM | POA: Diagnosis not present

## 2023-06-06 DIAGNOSIS — L03221 Cellulitis of neck: Secondary | ICD-10-CM | POA: Diagnosis not present

## 2023-06-06 LAB — BASIC METABOLIC PANEL
Anion gap: 8 (ref 5–15)
BUN: 16 mg/dL (ref 6–20)
CO2: 25 mmol/L (ref 22–32)
Calcium: 8.7 mg/dL — ABNORMAL LOW (ref 8.9–10.3)
Chloride: 101 mmol/L (ref 98–111)
Creatinine, Ser: 0.74 mg/dL (ref 0.44–1.00)
GFR, Estimated: >60 mL/min (ref >60)
Glucose, Bld: 290 mg/dL — ABNORMAL HIGH (ref 70–99)
Potassium: 3.5 mmol/L (ref 3.5–5.1)
Sodium: 134 mmol/L — ABNORMAL LOW (ref 135–145)

## 2023-06-06 LAB — CBC
HCT: 34.7 % — ABNORMAL LOW (ref 36.0–46.0)
Hemoglobin: 11.3 g/dL — ABNORMAL LOW (ref 12.0–15.0)
MCH: 26.8 pg (ref 26.0–34.0)
MCHC: 32.6 g/dL (ref 30.0–36.0)
MCV: 82.2 fL (ref 80.0–100.0)
Platelets: 247 10*3/uL (ref 150–400)
RBC: 4.22 MIL/uL (ref 3.87–5.11)
RDW: 13.1 % (ref 11.5–15.5)
WBC: 8.7 10*3/uL (ref 4.0–10.5)
nRBC: 0 % (ref 0.0–0.2)

## 2023-06-06 LAB — GLUCOSE, CAPILLARY
Glucose-Capillary: 262 mg/dL — ABNORMAL HIGH (ref 70–99)
Glucose-Capillary: 267 mg/dL — ABNORMAL HIGH (ref 70–99)
Glucose-Capillary: 282 mg/dL — ABNORMAL HIGH (ref 70–99)
Glucose-Capillary: 298 mg/dL — ABNORMAL HIGH (ref 70–99)
Glucose-Capillary: 304 mg/dL — ABNORMAL HIGH (ref 70–99)
Glucose-Capillary: 311 mg/dL — ABNORMAL HIGH (ref 70–99)
Glucose-Capillary: 412 mg/dL — ABNORMAL HIGH (ref 70–99)

## 2023-06-06 LAB — HEMOGLOBIN A1C
Hgb A1c MFr Bld: 9.2 % — ABNORMAL HIGH (ref 4.8–5.6)
Mean Plasma Glucose: 217 mg/dL

## 2023-06-06 LAB — MAGNESIUM: Magnesium: 1.8 mg/dL (ref 1.7–2.4)

## 2023-06-06 LAB — MRSA NEXT GEN BY PCR, NASAL: MRSA by PCR Next Gen: NOT DETECTED

## 2023-06-06 MED ORDER — CEFADROXIL 500 MG PO CAPS: 500.0000 mg | ORAL_CAPSULE | Freq: Two times a day (BID) | ORAL | Status: DC

## 2023-06-06 MED ORDER — ENOXAPARIN SODIUM 40 MG/0.4ML IJ SOSY
40.0000 mg | PREFILLED_SYRINGE | INTRAMUSCULAR | Status: DC
  Administered 2023-06-06: 40 mg via SUBCUTANEOUS
  Filled 2023-06-06: qty 0.4

## 2023-06-06 MED ORDER — INSULIN ASPART 100 UNIT/ML IJ SOLN
5.0000 [IU] | Freq: Three times a day (TID) | INTRAMUSCULAR | Status: DC
  Administered 2023-06-06 – 2023-06-07 (×3): 5 [IU] via SUBCUTANEOUS

## 2023-06-06 MED ORDER — INSULIN NPH (HUMAN) (ISOPHANE) 100 UNIT/ML ~~LOC~~ SUSP
20.0000 [IU] | Freq: Two times a day (BID) | SUBCUTANEOUS | Status: DC
  Administered 2023-06-06 – 2023-06-07 (×2): 20 [IU] via SUBCUTANEOUS
  Filled 2023-06-06: qty 10

## 2023-06-06 MED ORDER — DOXYCYCLINE HYCLATE 100 MG PO TABS
100.0000 mg | ORAL_TABLET | Freq: Two times a day (BID) | ORAL | Status: DC
  Administered 2023-06-06 – 2023-06-07 (×3): 100 mg via ORAL
  Filled 2023-06-06 (×3): qty 1

## 2023-06-06 MED ORDER — INSULIN ASPART 100 UNIT/ML IJ SOLN
0.0000 [IU] | Freq: Every day | INTRAMUSCULAR | Status: DC
  Administered 2023-06-06: 4 [IU] via SUBCUTANEOUS

## 2023-06-06 MED ORDER — INSULIN ASPART 100 UNIT/ML IJ SOLN
0.0000 [IU] | Freq: Three times a day (TID) | INTRAMUSCULAR | Status: DC
  Administered 2023-06-06: 8 [IU] via SUBCUTANEOUS
  Administered 2023-06-07: 15 [IU] via SUBCUTANEOUS
  Administered 2023-06-07: 8 [IU] via SUBCUTANEOUS

## 2023-06-06 NOTE — Progress Notes (Signed)
Asked to see this patient to evaluate her wound.  She has chronic skin changes that are very thick.  There is no cellulitis or fluctuance noted.  Previously I&D's area is healing and no drainage is noted.  Patient states that she was able to see the area today and it was looking much better than it had in the past.  Her CT was also reviewed with no evidence of cellulitis or abscess.  Continue current care.  No surgical intervention warranted.  Discussed with primary team.  Letha Cape 2:49 PM 06/06/2023

## 2023-06-06 NOTE — Plan of Care (Signed)

## 2023-06-06 NOTE — Plan of Care (Signed)
  Problem: Coping: Goal: Ability to adjust to condition or change in health will improve Outcome: Progressing   Problem: Metabolic: Goal: Ability to maintain appropriate glucose levels will improve Outcome: Progressing   Problem: Skin Integrity: Goal: Risk for impaired skin integrity will decrease Outcome: Progressing   Problem: Education: Goal: Knowledge of General Education information will improve Description: Including pain rating scale, medication(s)/side effects and non-pharmacologic comfort measures Outcome: Progressing

## 2023-06-06 NOTE — Progress Notes (Signed)
Mobility Specialist: Progress Note   06/06/23 1552  Mobility  Activity Ambulated independently in hallway  Level of Assistance Independent  Assistive Device None  Distance Ambulated (ft) 550 ft  Activity Response Tolerated well  Mobility Referral No  $Mobility charge 1 Mobility  Mobility Specialist Start Time (ACUTE ONLY) 1157  Mobility Specialist Stop Time (ACUTE ONLY) 1201  Mobility Specialist Time Calculation (min) (ACUTE ONLY) 4 min    Received pt ambulating in room having no complaints and agreeable to mobility. Pt was asymptomatic throughout ambulation and returned to room w/o fault. Left ambulating in room w/ call bell in reach and all needs met.  Maurene Capes Mobility Specialist Please contact via SecureChat or Rehab office at [phone]

## 2023-06-06 NOTE — Progress Notes (Signed)
PROGRESS NOTE  Laurie Rubio ZOX:[id number] DOB: 1981/03/05   PCP: Ailene Ravel, MD  Patient is from: Home  DOA: 06/04/2023 LOS: 1  Chief complaints Chief Complaint  Patient presents with   Abscess     Brief Narrative / Interim history: 42 year old F with PMH of DM-2, HTN, HLD and morbid obesity presenting with swelling, redness and induration in her posterior neck and adjacent upper back that did not improve with p.o. Augmentin outpatient, and admitted with working diagnosis of cellulitis.  Reportedly had purulent drainage on presentation.  WBC 10.6.  Lactic acid 2.0.  CT soft tissue neck with contrast did not show abscess, fluid collection or stranding.  She was started on ceftriaxone and vancomycin.  And admitted.    Subjective: Seen and examined earlier this morning.  No major events overnight of this morning.  Continues to endorse some tenderness and neck pain with range of motion.  She denies trismus or difficulty swallowing.  Objective: Vitals:   06/05/23 0728 06/05/23 1533 06/05/23 2037 06/06/23 0747  BP: 112/71 138/69 127/83 133/86  Pulse: 84 96 88 91  Resp: 18 18 18 17   Temp: 97.8 F (36.6 C) 98.3 F (36.8 C) 98.2 F (36.8 C) 98.2 F (36.8 C)  TempSrc: Oral Oral Oral Oral  SpO2: 97% 97% 97% 97%  Weight:      Height:        Examination:  GENERAL: No apparent distress.  Nontoxic. HEENT: MMM.  Vision and hearing grossly intact.  NECK: Supple.  No apparent JVD.  RESP:  No IWOB.  Fair aeration bilaterally. CVS:  RRR. Heart sounds normal.  ABD/GI/GU: BS+. Abd soft, NTND.  MSK/EXT:  Moves extremities. No apparent deformity. No edema.  SKIN: Horizontal skin wound across posterior neck crease measuring about 3/4 of an inch with no purulent drainage.  Some skin induration over adjacent part of upper back.  Deep fluctuance?  No significant tenderness. NEURO: Awake, alert and oriented appropriately.  No apparent focal neuro deficit. PSYCH: Calm. Normal affect.    Procedures:  None  Microbiology summarized: MRSA PCR screen nonreactive.  Assessment and plan: Principal Problem:   Cellulitis Active Problems:   DM (diabetes mellitus), type 2 with complications (HCC)   Hyperlipidemia   Essential hypertension   Elevated lactic acid level  Cellulitis of the neck: Reportedly had purulent drainage on presentation.  Failed outpatient treatment with p.o. clindamycin and p.o. Augmentin in August of September respectively.  CT without abscess or fluid collection.  MRSA PCR screen negative.  Had a mild leukocytosis but no fever or other conditions and symptoms.  Patient was sent to ED by general surgery.  Overall, patient reports improvement. -De-escalate antibiotic to p.o. doxycycline. -Requested surgical team to assess patient -Optimize glycemic control  Uncontrolled diabetes with hyperglycemia: A1c 9.2%. Recent Labs  Lab 06/05/23 2039 06/05/23 2345 06/06/23 0347 06/06/23 0745 06/06/23 1130  GLUCAP 361* 321* 262* 304* 282*  -Increase NPH from 15 to 20 units twice daily -Increase SSI from sensitive to moderate.  At night coverage. -Add NovoLog 5 units 3 times daily with meals -Needs statin if no contraindication.   Hyperlipidemia: On fenofibrate. -Needs statin if no contraindication.   Essential hypertension.   -Continue lisinopril.   Chronic anemia: Stable Recent Labs    06/04/23 1614 06/05/23 0532 06/06/23 0402  HGB 11.8* 11.0* 11.3*  -Continue monitoring  Morbid obesity: Elevated BMI with comorbidity as above Body mass index is 37.6 kg/m. -Consider GLP-1 agonist outpatient -Encourage lifestyle change to  lose weight          DVT prophylaxis:  enoxaparin (LOVENOX) injection 40 mg Start: 06/06/23 1545 SCDs Start: 06/05/23 0140  Code Status: Full code Family Communication: None at bedside Level of care: Med-Surg Status is: Inpatient Remains inpatient appropriate because: Neck cellulitis   Final disposition:  Home Consultants:  General surgery  35 minutes with more than 50% spent in reviewing records, counseling patient/family and coordinating care.   Sch Meds:  Scheduled Meds:  doxycycline  100 mg Oral Q12H   enoxaparin (LOVENOX) injection  40 mg Subcutaneous Q24H   fenofibrate  160 mg Oral Daily   insulin aspart  0-15 Units Subcutaneous TID WC   insulin aspart  0-5 Units Subcutaneous QHS   insulin aspart  5 Units Subcutaneous TID WC   insulin NPH Human  20 Units Subcutaneous BID AC & HS   lisinopril  5 mg Oral Daily   Continuous Infusions:   PRN Meds:.acetaminophen **OR** acetaminophen, HYDROcodone-acetaminophen, ondansetron **OR** ondansetron (ZOFRAN) IV  Antimicrobials: Anti-infectives (From admission, onward)    Start     Dose/Rate Route Frequency Ordered Stop   06/06/23 1545  doxycycline (VIBRA-TABS) tablet 100 mg        100 mg Oral Every 12 hours 06/06/23 1450 06/13/23 0959   06/06/23 1530  cefadroxil (DURICEF) capsule 500 mg  Status:  Discontinued        500 mg Oral 2 times daily 06/06/23 1440 06/06/23 1450   06/05/23 2200  vancomycin (VANCOREADY) IVPB 1750 mg/350 mL  Status:  Discontinued        1,750 mg 175 mL/hr over 120 Minutes Intravenous Every 24 hours 06/05/23 0903 06/06/23 1440   06/05/23 1100  vancomycin (VANCOREADY) IVPB 1250 mg/250 mL  Status:  Discontinued        1,250 mg 166.7 mL/hr over 90 Minutes Intravenous Every 12 hours 06/05/23 0048 06/05/23 0835   06/05/23 0000  cefTRIAXone (ROCEPHIN) 2 g in sodium chloride 0.9 % 100 mL IVPB  Status:  Discontinued        2 g 200 mL/hr over 30 Minutes Intravenous Every 24 hours 06/04/23 2345 06/06/23 1440   06/04/23 2315  vancomycin (VANCOREADY) IVPB 2000 mg/400 mL        2,000 mg 200 mL/hr over 120 Minutes Intravenous  Once 06/04/23 2308 06/05/23 0141   06/04/23 2230  ceFAZolin (ANCEF) IVPB 2g/100 mL premix        2 g 200 mL/hr over 30 Minutes Intravenous  Once 06/04/23 2218 06/04/23 2317        I have  personally reviewed the following labs and images: CBC: Recent Labs  Lab 06/04/23 1614 06/05/23 0532 06/06/23 0402  WBC 10.6* 10.2 8.7  HGB 11.8* 11.0* 11.3*  HCT 37.1 33.8* 34.7*  MCV 81.9 82.8 82.2  PLT 275 256 247   BMP &GFR Recent Labs  Lab 06/04/23 1614 06/05/23 0430 06/05/23 0532 06/06/23 0402  NA 136  --  136 134*  K 3.7  --  3.8 3.5  CL 101  --  100 101  CO2 26  --  26 25  GLUCOSE 284*  --  270* 290*  BUN 12  --  13 16  CREATININE 0.84  --  0.74 0.74  CALCIUM 8.8*  --  9.0 8.7*  MG  --  1.7 1.7 1.8  PHOS  --  3.8 3.8  --    Estimated Creatinine Clearance: 106.5 mL/min (by C-G formula based on SCr of 0.74 mg/dL). Liver &  Pancreas: Recent Labs  Lab 06/04/23 1614 06/05/23 0532  AST 31 29  ALT 36 34  ALKPHOS 31* 30*  BILITOT 0.7 0.6  PROT 7.2 6.4*  ALBUMIN 3.6 3.5   No results for input(s): "LIPASE", "AMYLASE" in the last 168 hours. No results for input(s): "AMMONIA" in the last 168 hours. Diabetic: Recent Labs    06/05/23 0430  HGBA1C 9.2*   Recent Labs  Lab 06/05/23 2039 06/05/23 2345 06/06/23 0347 06/06/23 0745 06/06/23 1130  GLUCAP 361* 321* 262* 304* 282*   Cardiac Enzymes: Recent Labs  Lab 06/05/23 0430  CKTOTAL 198   No results for input(s): "PROBNP" in the last 8760 hours. Coagulation Profile: No results for input(s): "INR", "PROTIME" in the last 168 hours. Thyroid Function Tests: Recent Labs    06/05/23 0430  TSH 0.704   Lipid Profile: Recent Labs    06/04/23 2352  CHOL 155  HDL 28*  LDLCALC 100*  TRIG 136  CHOLHDL 5.5   Anemia Panel: No results for input(s): "VITAMINB12", "FOLATE", "FERRITIN", "TIBC", "IRON", "RETICCTPCT" in the last 72 hours. Urine analysis:    Component Value Date/Time   COLORURINE YELLOW 09/28/2021 1027   APPEARANCEUR CLEAR 09/28/2021 1027   LABSPEC 1.022 09/28/2021 1027   PHURINE 5.0 09/28/2021 1027   GLUCOSEU >=500 (A) 09/28/2021 1027   HGBUR NEGATIVE 09/28/2021 1027   BILIRUBINUR  NEGATIVE 09/28/2021 1027   KETONESUR NEGATIVE 09/28/2021 1027   PROTEINUR 100 (A) 09/28/2021 1027   NITRITE NEGATIVE 09/28/2021 1027   LEUKOCYTESUR NEGATIVE 09/28/2021 1027   Sepsis Labs: Invalid input(s): "PROCALCITONIN", "LACTICIDVEN"  Microbiology: Recent Results (from the past 240 hour(s))  MRSA Next Gen by PCR, Nasal     Status: None   Collection Time: 06/06/23  8:15 AM   Specimen: Nasal Mucosa; Nasal Swab  Result Value Ref Range Status   MRSA by PCR Next Gen NOT DETECTED NOT DETECTED Final    Comment: (NOTE) The GeneXpert MRSA Assay (FDA approved for NASAL specimens only), is one component of a comprehensive MRSA colonization surveillance program. It is not intended to diagnose MRSA infection nor to guide or monitor treatment for MRSA infections. Test performance is not FDA approved in patients less than 64 years old. Performed at [facility], [address] [place], [place] [address]     Radiology Studies: No results found.    Majorie Santee T. Pryor Guettler [facility]  If 7PM-7AM, please contact night-coverage [email or web address] 06/06/2023, 2:51 PM

## 2023-06-06 NOTE — Inpatient Diabetes Management (Signed)
Inpatient Diabetes Program Recommendations  AACE/ADA: New Consensus Statement on Inpatient Glycemic Control (2015)  Target Ranges:  Prepandial:   less than 140 mg/dL      Peak postprandial:   less than 180 mg/dL (1-2 hours)      Critically ill patients:  140 - 180 mg/dL   Lab Results  Component Value Date   GLUCAP 282 (H) 06/06/2023   HGBA1C 9.2 (H) 06/05/2023    Review of Glycemic Control  Latest Reference Range & Units 06/05/23 07:26 06/05/23 11:24 06/05/23 15:30 06/05/23 20:39 06/05/23 23:45 06/06/23 03:47 06/06/23 07:45 06/06/23 11:30  Glucose-Capillary 70 - 99 mg/dL 098 (H) 119 (H) 147 (H) 361 (H) 321 (H) 262 (H) 304 (H) 282 (H)   Diabetes history: DM type 2 Outpatient Diabetes medications: Concentrated Humulin R U-500 insulin in Omnipod insulin pump in addition to Humulin N 20 units bid Insulin pump settings 12 A-6:30 A      1 unit/hour 6:30 A-12 A      1.9 units/hour  Basal in a 24 hour period    39.75 units Sensitivity 1 unit drops glucose 30 points Carb ratio 1 unit for every 5 grams of carbohydrates  Current orders for Inpatient glycemic control:  Novolin N (NPH) 15 units bid Novolog 0-9 units Q4 hours  Endocrinologist Dr. Talmage Nap  Inpatient Diabetes Program Recommendations:    -   Increase NPH to 20 units bid -   Add Novolog 5 units tid meal coverage if eating >50% of meals   Thanks,  Christena Deem RN, MSN, BC-ADM Inpatient Diabetes Coordinator Team Pager [phone] (8a-5p)

## 2023-06-07 DIAGNOSIS — L03221 Cellulitis of neck: Secondary | ICD-10-CM | POA: Diagnosis not present

## 2023-06-07 DIAGNOSIS — I10 Essential (primary) hypertension: Secondary | ICD-10-CM | POA: Diagnosis not present

## 2023-06-07 DIAGNOSIS — E118 Type 2 diabetes mellitus with unspecified complications: Secondary | ICD-10-CM | POA: Diagnosis not present

## 2023-06-07 DIAGNOSIS — R7989 Other specified abnormal findings of blood chemistry: Secondary | ICD-10-CM | POA: Diagnosis not present

## 2023-06-07 LAB — LIPID PANEL
Cholesterol: 186 mg/dL (ref 0–200)
HDL: 30 mg/dL — ABNORMAL LOW (ref >40)
LDL Cholesterol: 104 mg/dL — ABNORMAL HIGH (ref 0–99)
Total CHOL/HDL Ratio: 6.2 ratio
Triglycerides: 260 mg/dL — ABNORMAL HIGH (ref <150)
VLDL: 52 mg/dL — ABNORMAL HIGH (ref 0–40)

## 2023-06-07 LAB — CBC
HCT: 37.4 % (ref 36.0–46.0)
Hemoglobin: 12.3 g/dL (ref 12.0–15.0)
MCH: 26.7 pg (ref 26.0–34.0)
MCHC: 32.9 g/dL (ref 30.0–36.0)
MCV: 81.3 fL (ref 80.0–100.0)
Platelets: 292 10*3/uL (ref 150–400)
RBC: 4.6 MIL/uL (ref 3.87–5.11)
RDW: 13.2 % (ref 11.5–15.5)
WBC: 8.5 10*3/uL (ref 4.0–10.5)
nRBC: 0 % (ref 0.0–0.2)

## 2023-06-07 LAB — RENAL FUNCTION PANEL
Albumin: 3.6 g/dL (ref 3.5–5.0)
Anion gap: 13 (ref 5–15)
BUN: 14 mg/dL (ref 6–20)
CO2: 22 mmol/L (ref 22–32)
Calcium: 9.1 mg/dL (ref 8.9–10.3)
Chloride: 99 mmol/L (ref 98–111)
Creatinine, Ser: 0.83 mg/dL (ref 0.44–1.00)
GFR, Estimated: >60 mL/min (ref >60)
Glucose, Bld: 270 mg/dL — ABNORMAL HIGH (ref 70–99)
Phosphorus: 3.7 mg/dL (ref 2.5–4.6)
Potassium: 3.4 mmol/L — ABNORMAL LOW (ref 3.5–5.1)
Sodium: 134 mmol/L — ABNORMAL LOW (ref 135–145)

## 2023-06-07 LAB — GLUCOSE, CAPILLARY
Glucose-Capillary: 233 mg/dL — ABNORMAL HIGH (ref 70–99)
Glucose-Capillary: 294 mg/dL — ABNORMAL HIGH (ref 70–99)
Glucose-Capillary: 358 mg/dL — ABNORMAL HIGH (ref 70–99)

## 2023-06-07 LAB — MAGNESIUM: Magnesium: 1.9 mg/dL (ref 1.7–2.4)

## 2023-06-07 MED ORDER — DOXYCYCLINE HYCLATE 100 MG PO TABS: 100.0000 mg | ORAL_TABLET | Freq: Two times a day (BID) | ORAL | 0 refills | Status: DC

## 2023-06-07 MED ORDER — POTASSIUM CHLORIDE CRYS ER 20 MEQ PO TBCR
60.0000 meq | EXTENDED_RELEASE_TABLET | Freq: Once | ORAL | Status: AC
  Administered 2023-06-07: 60 meq via ORAL
  Filled 2023-06-07: qty 3

## 2023-06-07 MED ORDER — FENOFIBRATE 145 MG PO TABS: 145.0000 mg | ORAL_TABLET | Freq: Every day | ORAL | 11 refills | Status: AC

## 2023-06-07 NOTE — Plan of Care (Signed)

## 2023-06-07 NOTE — Discharge Summary (Signed)
Physician Discharge Summary  Laurie Rubio ZOX:[id number] DOB: 02/12/1981 DOA: 06/04/2023  PCP: Ailene Ravel, MD  Admit date: 06/04/2023 Discharge date: 06/07/2023 Admitted From: Home Disposition: Home Recommendations for Outpatient Follow-up:  Follow up with PCP in 1 week Reassess neck infection, glycemic control, CMP and CBC at follow-up Please follow up on the following pending results: None  Home Health: Not indicated Equipment/Devices: Not indicated  Discharge Condition: Stable CODE STATUS: Full code  Follow-up Information     Hamrick, Durward Fortes, MD Follow up.   Specialty: Family Medicine Contact information: [address] [address] [phone]                 Hospital course 42 year old F with PMH of DM-2, HTN, HLD and morbid obesity presenting with swelling, redness and induration in her posterior neck and adjacent upper back that did not improve with p.o. Augmentin outpatient, and admitted with working diagnosis of cellulitis.  Reportedly had purulent drainage on presentation.  WBC 10.6.  Lactic acid 2.0.  CT soft tissue neck with contrast did not show abscess, fluid collection or stranding.  She was started on ceftriaxone and vancomycin and admitted.  Patient was assessed by general surgery who did not feel there is indication for surgical intervention.  Cellulitis improved with IV antibiotics.  Antibiotics escalated to p.o. doxycycline, and she is discharged on p.o. doxycycline for 7 more days to complete a total of 8 days course.  Outpatient follow-up with PCP in 1 to 2 weeks.  Emphasized the importance of good glycemic control.   See individual problem list below for more.   Problems addressed during this hospitalization Principal Problem:   Cellulitis Active Problems:   DM (diabetes mellitus), type 2 with complications (HCC)   Hyperlipidemia   Essential hypertension   Elevated lactic acid level   Cellulitis of the neck: Reportedly had  purulent drainage on presentation.  Failed outpatient treatment with p.o. clindamycin and p.o. Augmentin in August of September respectively.  CT without abscess or fluid collection.  MRSA PCR screen negative.  Had a mild leukocytosis but no fever or other conditions and symptoms.  Evaluated by general surgery in house.  Overall, patient reports improvement. -Received vancomycin and ceftriaxone in house and discharged on p.o. doxycycline -Patient follow-up with PCP   Uncontrolled diabetes with hyperglycemia: A1c 9.2%. -Emphasized the importance of good glycemic control -Consider GLP-1 agonist if no contraindication -Continue statin.   Hyperlipidemia: On statin and fenofibrate   Essential hypertension.   -Continue lisinopril.   Chronic anemia: Stable -Recheck CBC in 1 week  Hypokalemia: Replenished prior to discharge   Morbid obesity: Elevated BMI with comorbidity as above Body mass index is 37.6 kg/m. -Consider GLP-1 agonist -Encourage lifestyle change to lose weight           Time spent 35 minutes  Vital signs Vitals:   06/06/23 1451 06/06/23 2127 06/07/23 0440 06/07/23 0750  BP: 114/68 136/82 125/76 120/77  Pulse: 80 79 80 92  Temp: 99.3 F (37.4 C) 98.6 F (37 C) 97.8 F (36.6 C) 97.7 F (36.5 C)  Resp: 18 18 16 18   Height:      Weight:      SpO2: 96% 96% 97% 96%  TempSrc: Oral Oral Oral   BMI (Calculated):         Discharge exam  GENERAL: No apparent distress.  Nontoxic. HEENT: MMM.  Vision and hearing grossly intact.  NECK: Supple.  No apparent JVD.  Small ulceration  over the neck crease posteriorly.  No drainage.  Erythema resolved.  No fluctuance.  Full range of motion. RESP:  No IWOB.  Fair aeration bilaterally. CVS:  RRR. Heart sounds normal.  ABD/GI/GU: BS+. Abd soft, NTND.  MSK/EXT:  Moves extremities. No apparent deformity. No edema.  SKIN: See neck exam NEURO: Awake and alert. Oriented appropriately.  No apparent focal neuro deficit. PSYCH:  Calm. Normal affect.   Discharge Instructions Discharge Instructions     Diet - low sodium heart healthy   Complete by: As directed    Diet Carb Modified   Complete by: As directed    Discharge instructions   Complete by: As directed    It has been a pleasure taking care of you!  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  We are discharging you on oral antibiotics to complete treatment course.  Please take your medications as prescribed.  Follow-up with your primary care doctor in 1 to 2 weeks or sooner if needed   Take care,   Discharge wound care:   Complete by: As directed    Wash with soap and water and tap dry one to two time a day   Increase activity slowly   Complete by: As directed       Allergies as of 06/07/2023   No Known Allergies      Medication List     STOP taking these medications    amoxicillin-clavulanate 875-125 MG tablet Commonly known as: AUGMENTIN   fenofibrate micronized 134 MG capsule Commonly known as: LOFIBRA       TAKE these medications    atorvastatin 40 MG tablet Commonly known as: LIPITOR Take 40 mg by mouth daily.   doxycycline 100 MG tablet Commonly known as: VIBRA-TABS Take 1 tablet (100 mg total) by mouth every 12 (twelve) hours.   fenofibrate 145 MG tablet Commonly known as: Tricor Take 1 tablet (145 mg total) by mouth daily.   HumuLIN N KwikPen 100 UNIT/ML KwikPen Generic drug: Insulin NPH (Human) (Isophane) Inject 20 Units into the skin 2 (two) times daily.   icosapent Ethyl 1 g capsule Commonly known as: VASCEPA Take 2 g by mouth 2 (two) times daily.   insulin regular 100 units/mL injection Commonly known as: NOVOLIN R Inject into the skin 3 (three) times daily before meals. Sliding scale depending on how many carbs pts eats   lisinopril 5 MG tablet Commonly known as: ZESTRIL Take 5 mg by mouth daily.   metFORMIN 500 MG tablet Commonly known  as: GLUCOPHAGE Take 1,000 mg by mouth 2 (two) times daily with a meal.               Discharge Care Instructions  (From admission, onward)           Start     Ordered   06/07/23 0000  Discharge wound care:       Comments: Wash with soap and water and tap dry one to two time a day   06/07/23 1002            Consultations: General surgery  Procedures/Studies:   CT Soft Tissue Neck W Contrast  Result Date: 06/04/2023 CLINICAL DATA:  Poorly healing abscess of the neck EXAM: CT NECK WITH CONTRAST TECHNIQUE: Multidetector CT imaging of the neck was performed using the standard protocol following the bolus administration of intravenous contrast. RADIATION DOSE REDUCTION: This exam was performed according to the departmental  dose-optimization program which includes automated exposure control, adjustment of the mA and/or kV according to patient size and/or use of iterative reconstruction technique. CONTRAST:  75mL OMNIPAQUE IOHEXOL 350 MG/ML SOLN COMPARISON:  None Available. FINDINGS: Pharynx and larynx: Normal Salivary glands: Normal Thyroid: Enlarged and heterogeneous thyroid gland has been previously evaluated with ultrasound. Lymph nodes: No enlarged or abnormal density lymph nodes. Vascular: Negative Limited intracranial: Normal Visualized orbits: Normal Mastoids and visualized paranasal sinuses: Clear Skeleton: Negative Upper chest: Clear Other: No abscess or fluid collection within the soft tissues at the posterior base of the neck IMPRESSION: 1. No abscess or fluid collection within the soft tissues at the posterior base of the neck. 2. Enlarged and heterogeneous thyroid gland has been previously evaluated with ultrasound. These results were called by telephone at the time of interpretation on 06/04/2023 at 10:21 pm to Dr. Andria Meuse, who verbally acknowledged these results. Electronically Signed   By: Deatra Robinson M.D.   On: 06/04/2023 22:21       The results of significant  diagnostics from this hospitalization (including imaging, microbiology, ancillary and laboratory) are listed below for reference.     Microbiology: Recent Results (from the past 240 hour(s))  MRSA Next Gen by PCR, Nasal     Status: None   Collection Time: 06/06/23  8:15 AM   Specimen: Nasal Mucosa; Nasal Swab  Result Value Ref Range Status   MRSA by PCR Next Gen NOT DETECTED NOT DETECTED Final    Comment: (NOTE) The GeneXpert MRSA Assay (FDA approved for NASAL specimens only), is one component of a comprehensive MRSA colonization surveillance program. It is not intended to diagnose MRSA infection nor to guide or monitor treatment for MRSA infections. Test performance is not FDA approved in patients less than 68 years old. Performed at [facility], [address] [place], [place] [address]      Labs:  CBC: Recent Labs  Lab 06/04/23 1614 06/05/23 0532 06/06/23 0402 06/07/23 0420  WBC 10.6* 10.2 8.7 8.5  HGB 11.8* 11.0* 11.3* 12.3  HCT 37.1 33.8* 34.7* 37.4  MCV 81.9 82.8 82.2 81.3  PLT 275 256 247 292   BMP &GFR Recent Labs  Lab 06/04/23 1614 06/05/23 0430 06/05/23 0532 06/06/23 0402 06/07/23 0420  NA 136  --  136 134* 134*  K 3.7  --  3.8 3.5 3.4*  CL 101  --  100 101 99  CO2 26  --  26 25 22   GLUCOSE 284*  --  270* 290* 270*  BUN 12  --  13 16 14   CREATININE 0.84  --  0.74 0.74 0.83  CALCIUM 8.8*  --  9.0 8.7* 9.1  MG  --  1.7 1.7 1.8 1.9  PHOS  --  3.8 3.8  --  3.7   Estimated Creatinine Clearance: 102.7 mL/min (by C-G formula based on SCr of 0.83 mg/dL). Liver & Pancreas: Recent Labs  Lab 06/04/23 1614 06/05/23 0532 06/07/23 0420  AST 31 29  --   ALT 36 34  --   ALKPHOS 31* 30*  --   BILITOT 0.7 0.6  --   PROT 7.2 6.4*  --   ALBUMIN 3.6 3.5 3.6   No results for input(s): "LIPASE", "AMYLASE" in the last 168 hours. No results for input(s): "AMMONIA" in the last 168 hours. Diabetic: Recent Labs    06/05/23 0430  HGBA1C 9.2*   Recent  Labs  Lab 06/06/23 1947 06/06/23 2120 06/06/23 2341 06/07/23 0354 06/07/23 0745  GLUCAP 412* 311* 267* 233* 294*   Cardiac Enzymes: Recent Labs  Lab 06/05/23 0430  CKTOTAL 198   No results for input(s): "PROBNP" in the last 8760 hours. Coagulation Profile: No results for input(s): "INR", "PROTIME" in the last 168 hours. Thyroid Function Tests: Recent Labs    06/05/23 0430  TSH 0.704   Lipid Profile: Recent Labs    06/04/23 2352 06/07/23 0420  CHOL 155 186  HDL 28* 30*  LDLCALC 100* 409*  TRIG 136 260*  CHOLHDL 5.5 6.2   Anemia Panel: No results for input(s): "VITAMINB12", "FOLATE", "FERRITIN", "TIBC", "IRON", "RETICCTPCT" in the last 72 hours. Urine analysis:    Component Value Date/Time   COLORURINE YELLOW 09/28/2021 1027   APPEARANCEUR CLEAR 09/28/2021 1027   LABSPEC 1.022 09/28/2021 1027   PHURINE 5.0 09/28/2021 1027   GLUCOSEU >=500 (A) 09/28/2021 1027   HGBUR NEGATIVE 09/28/2021 1027   BILIRUBINUR NEGATIVE 09/28/2021 1027   KETONESUR NEGATIVE 09/28/2021 1027   PROTEINUR 100 (A) 09/28/2021 1027   NITRITE NEGATIVE 09/28/2021 1027   LEUKOCYTESUR NEGATIVE 09/28/2021 1027   Sepsis Labs: Invalid input(s): "PROCALCITONIN", "LACTICIDVEN"   SIGNED:  Almon Hercules, MD  [facility] 06/07/2023, 10:03 AM

## 2023-07-09 ENCOUNTER — Other Ambulatory Visit
Admission: RE | Admit: 2023-07-09 | Discharge: 2023-07-09 | Disposition: A | Discharge status: Home / Self Care | Payer: Self-pay | Source: Ambulatory Visit | Attending: Oncology | Admitting: Oncology

## 2023-07-09 DIAGNOSIS — Z006 Encounter for examination for normal comparison and control in clinical research program: Secondary | ICD-10-CM | POA: Insufficient documentation

## 2023-07-20 LAB — HELIX MOLECULAR SCREEN: Genetic Analysis Overall Interpretation: NEGATIVE

## 2023-12-12 LAB — COLOGUARD: COLOGUARD: NEGATIVE

## 2024-05-06 ENCOUNTER — Other Ambulatory Visit: Payer: Self-pay

## 2024-05-06 ENCOUNTER — Other Ambulatory Visit (HOSPITAL_COMMUNITY)
Admission: RE | Admit: 2024-05-06 | Discharge: 2024-05-06 | Disposition: A | Discharge status: Home / Self Care | Source: Ambulatory Visit | Attending: Infectious Disease | Admitting: Infectious Disease

## 2024-05-06 ENCOUNTER — Ambulatory Visit (INDEPENDENT_AMBULATORY_CARE_PROVIDER_SITE_OTHER): Admitting: Pharmacist

## 2024-05-06 ENCOUNTER — Other Ambulatory Visit (HOSPITAL_COMMUNITY): Payer: Self-pay

## 2024-05-06 DIAGNOSIS — Z23 Encounter for immunization: Secondary | ICD-10-CM | POA: Diagnosis not present

## 2024-05-06 DIAGNOSIS — Z113 Encounter for screening for infections with a predominantly sexual mode of transmission: Secondary | ICD-10-CM

## 2024-05-06 DIAGNOSIS — Z2981 Encounter for HIV pre-exposure prophylaxis: Secondary | ICD-10-CM

## 2024-05-06 NOTE — Progress Notes (Signed)
 NEW REFERRAL TO CPP CLINIC    Date:  05/06/2024   HPI: Laurie Rubio is a 43 y.o. female who presents to the [facility] pharmacy clinic to discuss and initiate PrEP.  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   DOXYCYCLINE  (VIBRA -TABS) 100 MG TABLET    Take 1 tablet (100 mg total) by mouth every 12 (twelve) hours.   FENOFIBRATE  (TRICOR ) 145 MG TABLET    Take 1 tablet (145 mg total) by mouth daily.   HUMULIN  N KWIKPEN 100 UNIT/ML KWIKPEN    Inject 20 Units into the skin 2 (two) times daily.   ICOSAPENT ETHYL (VASCEPA) 1 G CAPSULE    Take 2 g by mouth 2 (two) times daily.   INSULIN  REGULAR (NOVOLIN R) 100 UNITS/ML INJECTION    Inject into the skin 3 (three) times daily before meals. Sliding scale depending on how many carbs pts eats   LISINOPRIL  (ZESTRIL ) 5 MG TABLET    Take 5 mg by mouth daily.   METFORMIN (GLUCOPHAGE) 500 MG TABLET    Take 1,000 mg by mouth 2 (two) times daily with a meal.   Modified Medications   No medications on file  Discontinued Medications   No medications on file    Allergies: No Known Allergies  Past Medical History: Past Medical History:  Diagnosis Date   Abdominal pain 09/2017   Diabetes mellitus without complication (HCC)    DKA, type 2 (HCC) 09/2017   Pancreatitis     Social History: Social History   Socioeconomic History   Marital status: Married    Spouse name: Not on file   Number of children: Not on  file   Years of education: Not on file   Highest education level: Not on file  Occupational History   Not on file  Tobacco Use   Smoking status: Never   Smokeless tobacco: Never  Vaping Use   Vaping status: Never Used  Substance and Sexual Activity   Alcohol use: No   Drug use: No   Sexual activity: Not on file  Other Topics Concern   Not on file  Social History Narrative   Not on file   Social Drivers of Health   Financial Resource Strain: Not on file  Food Insecurity: Not on file  Transportation Needs: Not on file  Physical Activity: Not on file  Stress: Not on file  Social Connections: Not on file       05/06/2024    3:19 PM  CHL HIV PREP FLOWSHEET RESULTS  Insurance Status Insured  Gender at birth Female  Sex Partners Both men and women  # sex partners past 3-6 mos 1  Sex activity preferences Insertive and receptive;Oral  Condom use No  Treated for STI? Yes  HIV symptoms? None  PrEP Eligibility Yes  Preg status No  Breastfeeding? No  Paper work received? Yes    Labs:  SCr: Lab Results  Component Value Date   CREATININE 0.83 06/07/2023   CREATININE 0.74 06/06/2023   CREATININE 0.74 06/05/2023   CREATININE 0.84 06/04/2023   CREATININE 0.62 09/28/2021   HIV Lab Results  Component Value Date   HIV Non Reactive 06/05/2023   HIV Non Reactive 10/07/2017   Hepatitis B No results found for: HEPBSAB, HEPBSAG, HEPBCAB Hepatitis C No results found for: HEPCAB, HCVRNAPCRQN Hepatitis A No results found for: HAV RPR and STI Lab Results  Component Value Date   LABRPR NON REACTIVE 04/20/2010        No data to display          Assessment: Laurie Rubio presents to clinic today to initiate PrEP. She was referred by her primary Laurie physician in [place] as her and her husband are opening their relationship soon and would like to protect themselves against HIV. She does not use condoms with her husband. Prefers Apretude . Counseled that Apretude   is one intramuscular injection in the gluteal muscle for each visit. Explained that the second injection is 30 days after the initial injection then every 2 months thereafter. Discussed follow up appointments moving forward. Screened patient for acute HIV symptoms such as fatigue, muscle aches, rash, sore throat, lymphadenopathy, headache, night sweats, nausea/vomiting/diarrhea, and fever. Patient denies any symptoms.   Explained that showing up to injection appointments is very important and warned that if appointments are missed, protection will be minimal and the risk of acquiring HIV becomes much higher. Counseled on possible side effects associated with the injections such as injection site pain, which is usually mild to moderate in nature, injection site nodules, and injection site reactions. Asked to call the clinic or send me a mychart message if they experience any issues. Advised that they can take Motrin or Tylenol  for injection site pain if needed. They may also pre-treat with Motrin or Tylenol  about 30-45 minutes before scheduled appointments.  [name] starting Apretude  PA today. [name] PCP checked HIV antibody on 04/29/24 which resulted negative. Will check baseline labs aside from HIV antibody today. Eligible for HPV vaccination which she accepts today; 2/3 dose due in 1-2 months and 3/3 due in 5-6 months.   Plan: - Check urine pregnancy test, RPR, urine/oral/rectal cytologies, and hepatitis A/B/C serologies - Start Apretude  once PA approved  - Administer 1/3 HPV vaccine   Alan Geralds, PharmD, CPP, BCIDP, AAHIVP Clinical Pharmacist Practitioner Infectious Diseases Clinical Pharmacist [facility] 05/06/2024, 3:52 PM

## 2024-05-07 LAB — URINE CYTOLOGY ANCILLARY ONLY
Chlamydia: NEGATIVE
Comment: NEGATIVE
Comment: NORMAL
Neisseria Gonorrhea: NEGATIVE

## 2024-05-07 LAB — HEPATITIS B SURFACE ANTIGEN: Hepatitis B Surface Ag: NONREACTIVE

## 2024-05-07 LAB — RPR: RPR Ser Ql: NONREACTIVE

## 2024-05-07 LAB — HEPATITIS B CORE ANTIBODY, TOTAL: Hep B Core Total Ab: NONREACTIVE

## 2024-05-07 LAB — CYTOLOGY, (ORAL, ANAL, URETHRAL) ANCILLARY ONLY
Chlamydia: NEGATIVE
Chlamydia: NEGATIVE
Comment: NEGATIVE
Comment: NEGATIVE
Comment: NORMAL
Comment: NORMAL
Neisseria Gonorrhea: NEGATIVE
Neisseria Gonorrhea: NEGATIVE

## 2024-05-07 LAB — HEPATITIS B SURFACE ANTIBODY,QUALITATIVE: Hep B S Ab: REACTIVE — AB

## 2024-05-07 LAB — HEPATITIS C ANTIBODY: Hepatitis C Ab: NONREACTIVE

## 2024-05-07 LAB — HEPATITIS A ANTIBODY, TOTAL: Hepatitis A AB,Total: NONREACTIVE

## 2024-05-10 ENCOUNTER — Telehealth: Payer: Self-pay

## 2024-05-10 ENCOUNTER — Other Ambulatory Visit (HOSPITAL_COMMUNITY): Payer: Self-pay

## 2024-05-10 ENCOUNTER — Ambulatory Visit: Payer: Self-pay | Admitting: Pharmacist

## 2024-05-10 NOTE — Telephone Encounter (Signed)
 Submitted a Prior Authorization request to [employer] for Apretude  via Fax. Will update once we receive a response.  I faxed Pa form and chart notes & labs to [phone]

## 2024-05-10 NOTE — Telephone Encounter (Signed)
 Pharmacy Patient Advocate Encounter- Apretude  BIV-Pharmacy Benefit:  PA was submitted to [employer] and has been approved through: 05/10/24- [employer] (Subject to formulary changes) Authorization# Notice will be in Media  Phone # [phone]  Please send prescription to Specialty Pharmacy: [facility]: [phone]  Estimated Copay is: 0.00

## 2024-05-11 ENCOUNTER — Other Ambulatory Visit: Payer: Self-pay

## 2024-05-11 ENCOUNTER — Other Ambulatory Visit (HOSPITAL_COMMUNITY): Payer: Self-pay

## 2024-05-11 DIAGNOSIS — Z2981 Encounter for HIV pre-exposure prophylaxis: Secondary | ICD-10-CM

## 2024-05-11 MED ORDER — APRETUDE 600 MG/3ML IM SUER
600.0000 mg | INTRAMUSCULAR | 1 refills | Status: DC
  Filled 2024-05-11: qty 3, 30d supply, fill #0
  Filled 2024-06-09: qty 3, 30d supply, fill #1

## 2024-05-11 NOTE — Telephone Encounter (Signed)
 See response

## 2024-05-11 NOTE — Progress Notes (Signed)
 Specialty Pharmacy Initial Fill Coordination Note  Laurie Rubio is a 43 y.o. female contacted today regarding initial fill of specialty medication(s) Cabotegravir  (Apretude )   Patient requested Courier to Provider Office   Delivery date: 05/13/24   Verified address: [address] [place] [address]   Medication will be filled on 05/12/24.   Patient is aware of 0.00 copayment.

## 2024-05-13 ENCOUNTER — Telehealth: Payer: Self-pay

## 2024-05-13 NOTE — Telephone Encounter (Signed)
 [facility] Patient Advocate Encounter  Patient's medications APRETUDE  have been couriered to [facility] from [facility] and will be administered at the patients appointment on 05/20/24.  Charmaine Sharps, CPhT Specialty Pharmacy Patient [facility] for Infectious Disease Phone: [phone] Fax:  [phone]

## 2024-05-20 ENCOUNTER — Ambulatory Visit (INDEPENDENT_AMBULATORY_CARE_PROVIDER_SITE_OTHER): Admitting: Pharmacist

## 2024-05-20 ENCOUNTER — Other Ambulatory Visit: Payer: Self-pay

## 2024-05-20 DIAGNOSIS — Z2981 Encounter for HIV pre-exposure prophylaxis: Secondary | ICD-10-CM | POA: Diagnosis not present

## 2024-05-20 DIAGNOSIS — Z113 Encounter for screening for infections with a predominantly sexual mode of transmission: Secondary | ICD-10-CM

## 2024-05-20 MED ORDER — CABOTEGRAVIR ER 600 MG/3ML IM SUER
600.0000 mg | Freq: Once | INTRAMUSCULAR | Status: AC
  Administered 2024-05-20: 600 mg via INTRAMUSCULAR

## 2024-05-20 NOTE — Progress Notes (Signed)
 HPI: Laurie Rubio is a 43 y.o. female who presents to the [facility] pharmacy clinic for Apretude  administration and HIV PrEP follow up.  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   CABOTEGRAVIR  ER (APRETUDE ) 600 MG/3ML INJECTION    Inject 3 mLs (600 mg total) into the muscle every 30 (thirty) days.   DOXYCYCLINE  (VIBRA -TABS) 100 MG TABLET    Take 1 tablet (100 mg total) by mouth every 12 (twelve) hours.   FENOFIBRATE  (TRICOR ) 145 MG TABLET    Take 1 tablet (145 mg total) by mouth daily.   HUMULIN  N KWIKPEN 100 UNIT/ML KWIKPEN    Inject 20 Units into the skin 2 (two) times daily.   ICOSAPENT ETHYL (VASCEPA) 1 G CAPSULE    Take 2 g by mouth 2 (two) times daily.   INSULIN  REGULAR (NOVOLIN R) 100 UNITS/ML INJECTION    Inject into the skin 3 (three) times daily before meals. Sliding scale depending on how many carbs pts eats   LISINOPRIL  (ZESTRIL ) 5 MG TABLET    Take 5 mg by mouth daily.   METFORMIN (GLUCOPHAGE) 500 MG TABLET    Take 1,000 mg by mouth 2 (two) times daily with a meal.   Modified Medications   No medications on file  Discontinued Medications   No medications on file    Allergies: No Known Allergies  Labs: No results found for: HIV1RNAQUANT, HIV1RNAVL, CD4TABS  RPR and STI Lab Results  Component Value Date   LABRPR NON-REACTIVE 05/06/2024   LABRPR NON REACTIVE 04/20/2010    STI Results GC CT  05/06/2024  3:45 PM Negative     Negative    Negative  Negative    Negative    Negative     Hepatitis B Lab Results  Component Value Date   HEPBSAB REACTIVE (A) 05/06/2024   HEPBSAG NON-REACTIVE 05/06/2024   HEPBCAB NON-REACTIVE 05/06/2024   Hepatitis C Lab Results  Component Value Date   HEPCAB NON-REACTIVE 05/06/2024   Hepatitis A Lab Results  Component Value Date   HAV NON-REACTIVE 05/06/2024   Lipids: Lab Results  Component Value Date   CHOL 186 06/07/2023   TRIG 260 (H) 06/07/2023   HDL 30 (L) 06/07/2023   CHOLHDL 6.2 06/07/2023   VLDL 52 (H) 06/07/2023   LDLCALC 104 (H) 06/07/2023    Current PrEP Regimen: N/A  TARGET DATE: 28  Assessment: Laurie Rubio presents today for their first initiation injection of Apretude  and to follow up for HIV PrEP. We discussed that she is protected against HIV after her first dose. She had no other questions or concerns.   Counseled that Apretude  is one intramuscular injection in the gluteal muscle for each visit. Explained that the second injection is 30 days after the initial injection then every 2 months thereafter. Discussed follow up appointments moving forward. Screened for acute HIV symptoms such as fatigue, muscle aches, rash, sore  throat, lymphadenopathy, headache, night sweats, nausea/vomiting/diarrhea, and fever. Denies any symptoms.  No known exposures to any STIs since last visit. Defers STI testing today. Will check HIV RNA today.   Explained that showing up to injection appointments is very important and warned that if appointments are missed, protection will be minimal and the risk of acquiring HIV becomes much higher. Counseled on possible side effects associated with the injections such as injection site pain, which is usually mild to moderate in nature, injection site nodules, and injection site reactions. Asked to call the clinic or send me a mychart message if they experience any issues. Advised that she can take Motrin or Tylenol  for injection site  pain if needed. She may also pre-treat with Motrin or Tylenol  30-45 minutes before scheduled appointments.   Per [employer] guidelines, a rapid HIV test should be drawn prior to Apretude  administration. Due to state shortage of rapid HIV tests, this is temporarily unable to be done. Per decision from [facility] physicians, we will proceed with Apretude  administration at this time without a negative rapid HIV test beforehand. HIV RNA was collected today and is in process.  Administered cabotegravir  600mg /13mL in right upper outer quadrant of the gluteal muscle. Monitored patient for 10 minutes after injection. Injection was tolerated well without issue. Will make follow up appointments for second initiation injection in 30 days and then maintenance injections every 2 months thereafter.   Plan: - HIV RNA - First Apretude  Injection administered - Second initiation injection scheduled for 06/16/24 - Maintenance injections scheduled for 08/18/24 - Call with any issues or questions  Elma Fail, PharmD PGY1 Clinical Pharmacist [facility]  05/20/2024 12:28 PM

## 2024-05-22 LAB — HIV-1 RNA QUANT-NO REFLEX-BLD
HIV 1 RNA Quant: NOT DETECTED {copies}/mL
HIV-1 RNA Quant, Log: NOT DETECTED {Log_copies}/mL

## 2024-06-09 ENCOUNTER — Other Ambulatory Visit: Payer: Self-pay

## 2024-06-09 ENCOUNTER — Other Ambulatory Visit (HOSPITAL_COMMUNITY): Payer: Self-pay

## 2024-06-09 NOTE — Progress Notes (Signed)
 Specialty Pharmacy Refill Coordination Note  Laurie Rubio is a 43 y.o. female assessed today regarding refills of clinic administered specialty medication(s) Cabotegravir  (Apretude )   Clinic requested Courier to Provider Office   Delivery date: 06/14/24   Verified address: [address] [place] [address]   Medication will be filled on 06/11/24.

## 2024-06-11 ENCOUNTER — Other Ambulatory Visit: Payer: Self-pay

## 2024-06-14 ENCOUNTER — Telehealth: Payer: Self-pay

## 2024-06-14 NOTE — Telephone Encounter (Signed)
 [facility] Patient Advocate Encounter  Patient's medications Apretude  have been couriered to [facility] from [facility] and will be administered at the patients appointment on 06/16/24.  Laurie Rubio, CPhT Specialty Pharmacy Patient [facility] for Infectious Disease Phone: [phone] Fax:  [phone]

## 2024-06-15 NOTE — Progress Notes (Unsigned)
 HPI: Laurie Rubio is a 43 y.o. female who presents to the [facility] pharmacy clinic for Apretude  administration and HIV PrEP follow up.  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   CABOTEGRAVIR  ER (APRETUDE ) 600 MG/3ML INJECTION    Inject 3 mLs (600 mg total) into the muscle every 30 (thirty) days.   DOXYCYCLINE  (VIBRA -TABS) 100 MG TABLET    Take 1 tablet (100 mg total) by mouth every 12 (twelve) hours.   FENOFIBRATE  (TRICOR ) 145 MG TABLET    Take 1 tablet (145 mg total) by mouth daily.   HUMULIN  N KWIKPEN 100 UNIT/ML KWIKPEN    Inject 20 Units into the skin 2 (two) times daily.   ICOSAPENT ETHYL (VASCEPA) 1 G CAPSULE    Take 2 g by mouth 2 (two) times daily.   INSULIN  REGULAR (NOVOLIN R) 100 UNITS/ML INJECTION    Inject into the skin 3 (three) times daily before meals. Sliding scale depending on how many carbs pts eats   LISINOPRIL  (ZESTRIL ) 5 MG TABLET    Take 5 mg by mouth daily.   METFORMIN (GLUCOPHAGE) 500 MG TABLET    Take 1,000 mg by mouth 2 (two) times daily with a meal.   Modified Medications   No medications on file  Discontinued Medications   No medications on file    Allergies: No Known Allergies  Labs: Lab Results  Component Value Date   HIV1RNAQUANT NOT DETECTED 05/20/2024    RPR and STI Lab Results  Component Value Date   LABRPR NON-REACTIVE 05/06/2024   LABRPR NON REACTIVE 04/20/2010    STI Results GC CT  05/06/2024   3:45 PM Negative    Negative    Negative  Negative    Negative    Negative     Hepatitis B Lab Results  Component Value Date   HEPBSAB REACTIVE (A) 05/06/2024   HEPBSAG NON-REACTIVE 05/06/2024   HEPBCAB NON-REACTIVE 05/06/2024   Hepatitis C Lab Results  Component Value Date   HEPCAB NON-REACTIVE 05/06/2024   Hepatitis A Lab Results  Component Value Date   HAV NON-REACTIVE 05/06/2024   Lipids: Lab Results  Component Value Date   CHOL 186 06/07/2023   TRIG 260 (H) 06/07/2023   HDL 30 (L) 06/07/2023   CHOLHDL 6.2 06/07/2023   VLDL 52 (H) 06/07/2023   LDLCALC 104 (H) 06/07/2023    TARGET DATE: The 28th  Assessment: Laurie Rubio presents today for her second Apretude  injection and to follow up for HIV PrEP. No issues with past injection. Denies any symptoms of acute HIV. Last HIV RNA was negative on 05/20/24. No known exposures to any STIs since last visit. Politely declines STI testing today.   Per [employer] guidelines, a rapid HIV test should be drawn prior to Apretude  administration. Due to state shortage of rapid HIV tests, this is temporarily unable to be done. Per decision from [facility] physicians,  we will proceed with Apretude  administration at this time without a negative rapid HIV test beforehand. HIV RNA was collected today and is in process.  Administered cabotegravir  600mg /22mL in right upper outer quadrant of the gluteal muscle. Will see her back in 2 months for injection, labs, and HIV PrEP follow up.  Discussed eligibility for the updated flu, 2/3 HPV and 1/2 hepA vaccines. She gladly accepts all today. The 3rd and final HPV vaccine is due 11/06/24. The 2nd and final hepA vaccine is due in 6-12 months.  Plan: - Apretude  injection administered - HIV RNA today - Administered the flu and hepA vaccines in the right deltoid - Administered the HPV vaccine in the left deltoid - Next injection, labs, and PrEP follow up appointment scheduled for 08/24/24 and 10/14/24 with  Laurie Rubio - Call with any issues or questions  Izetta Carl, PharmD PGY1 Pharmacy Resident

## 2024-06-16 ENCOUNTER — Other Ambulatory Visit: Payer: Self-pay

## 2024-06-16 ENCOUNTER — Ambulatory Visit (INDEPENDENT_AMBULATORY_CARE_PROVIDER_SITE_OTHER): Payer: Self-pay | Admitting: Pharmacist

## 2024-06-16 DIAGNOSIS — Z113 Encounter for screening for infections with a predominantly sexual mode of transmission: Secondary | ICD-10-CM

## 2024-06-16 DIAGNOSIS — Z2981 Encounter for HIV pre-exposure prophylaxis: Secondary | ICD-10-CM

## 2024-06-16 DIAGNOSIS — Z23 Encounter for immunization: Secondary | ICD-10-CM

## 2024-06-16 MED ORDER — CABOTEGRAVIR ER 600 MG/3ML IM SUER
600.0000 mg | Freq: Once | INTRAMUSCULAR | Status: AC
  Administered 2024-06-16: 600 mg via INTRAMUSCULAR

## 2024-06-19 LAB — HIV-1 RNA QUANT-NO REFLEX-BLD
HIV 1 RNA Quant: NOT DETECTED {copies}/mL
HIV-1 RNA Quant, Log: NOT DETECTED {Log_copies}/mL

## 2024-08-11 ENCOUNTER — Other Ambulatory Visit (HOSPITAL_COMMUNITY): Payer: Self-pay

## 2024-08-11 ENCOUNTER — Other Ambulatory Visit: Payer: Self-pay

## 2024-08-11 ENCOUNTER — Other Ambulatory Visit: Payer: Self-pay | Admitting: Pharmacist

## 2024-08-11 DIAGNOSIS — Z2981 Encounter for HIV pre-exposure prophylaxis: Secondary | ICD-10-CM

## 2024-08-11 MED ORDER — APRETUDE 600 MG/3ML IM SUER
600.0000 mg | INTRAMUSCULAR | 5 refills | Status: AC
  Filled 2024-08-11: qty 3, 60d supply, fill #0
  Filled 2024-10-06 (×2): qty 3, 60d supply, fill #1

## 2024-08-11 NOTE — Progress Notes (Signed)
 Specialty Pharmacy Refill Coordination Note  Laurie Rubio is a 43 y.o. female assessed today regarding refills of clinic administered specialty medication(s) Cabotegravir  (Apretude )   Clinic requested Courier to Provider Office   Delivery date: 08/18/24   Verified address: [address] [place] [address]   Medication will be filled on 08/17/24.

## 2024-08-17 ENCOUNTER — Other Ambulatory Visit: Payer: Self-pay

## 2024-08-18 ENCOUNTER — Telehealth: Payer: Self-pay

## 2024-08-18 ENCOUNTER — Ambulatory Visit: Admitting: Pharmacist

## 2024-08-18 NOTE — Telephone Encounter (Signed)
 [facility] Patient Advocate Encounter  Patient's medications Apretude  have been couriered to [facility] from [facility] and will be administered at the patients appointment on 08/24/24.  [name] Hutchinson, CPhT Specialty Pharmacy Patient [facility] for Infectious Disease Phone: [phone] Fax:  [phone]

## 2024-08-24 ENCOUNTER — Ambulatory Visit: Admitting: Pharmacist

## 2024-08-24 ENCOUNTER — Other Ambulatory Visit: Payer: Self-pay

## 2024-08-24 DIAGNOSIS — Z2981 Encounter for HIV pre-exposure prophylaxis: Secondary | ICD-10-CM

## 2024-08-24 DIAGNOSIS — Z113 Encounter for screening for infections with a predominantly sexual mode of transmission: Secondary | ICD-10-CM

## 2024-08-24 MED ORDER — CABOTEGRAVIR ER 600 MG/3ML IM SUER
600.0000 mg | Freq: Once | INTRAMUSCULAR | Status: AC
  Administered 2024-08-24: 600 mg via INTRAMUSCULAR

## 2024-08-24 NOTE — Progress Notes (Unsigned)
 HPI: Laurie Rubio is a 43 y.o. female who presents to the [facility] pharmacy clinic for Apretude  administration and HIV PrEP follow up.  Referring ID Provider: Dr. 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   CABOTEGRAVIR  ER (APRETUDE ) 600 MG/3ML INJECTION    Inject 3 mLs (600 mg total) into the muscle every 2 (two) months.   DOXYCYCLINE  (VIBRA -TABS) 100 MG TABLET    Take 1 tablet (100 mg total) by mouth every 12 (twelve) hours.   FENOFIBRATE  (TRICOR ) 145 MG TABLET    Take 1 tablet (145 mg total) by mouth daily.   HUMULIN  N KWIKPEN 100 UNIT/ML KWIKPEN    Inject 20 Units into the skin 2 (two) times daily.   ICOSAPENT ETHYL (VASCEPA) 1 G CAPSULE    Take 2 g by mouth 2 (two) times daily.   INSULIN  REGULAR (NOVOLIN R) 100 UNITS/ML INJECTION    Inject into the skin 3 (three) times daily before meals. Sliding scale depending on how many carbs pts eats   LISINOPRIL  (ZESTRIL ) 5 MG TABLET    Take 5 mg by mouth daily.   METFORMIN (GLUCOPHAGE) 500 MG TABLET    Take 1,000 mg by mouth 2 (two) times daily with a meal.   Modified Medications   No medications on file  Discontinued Medications   No medications on file    Allergies: No Known Allergies  Labs: Lab Results  Component Value Date   HIV1RNAQUANT NOT DETECTED 06/16/2024   HIV1RNAQUANT NOT DETECTED 05/20/2024    RPR and STI Lab Results  Component Value Date   LABRPR NON-REACTIVE 05/06/2024   LABRPR NON REACTIVE  04/20/2010    STI Results GC CT  05/06/2024  3:45 PM Negative    Negative    Negative  Negative    Negative    Negative     Hepatitis B Lab Results  Component Value Date   HEPBSAB REACTIVE (A) 05/06/2024   HEPBSAG NON-REACTIVE 05/06/2024   HEPBCAB NON-REACTIVE 05/06/2024   Hepatitis C Lab Results  Component Value Date   HEPCAB NON-REACTIVE 05/06/2024   Hepatitis A Lab Results  Component Value Date   HAV NON-REACTIVE 05/06/2024   Lipids: Lab Results  Component Value Date   CHOL 186 06/07/2023   TRIG 260 (H) 06/07/2023   HDL 30 (L) 06/07/2023   CHOLHDL 6.2 06/07/2023   VLDL 52 (H) 06/07/2023   LDLCALC 104 (H) 06/07/2023    Target Date: 28th  Assessment: Laurie Rubio presents today for her Apretude  injection and to follow up for HIV PrEP. No issues with past injections. Denies any symptoms of acute HIV. Last HIV RNA was negative on 06/16/2024. Patient reports no new partners since last visit and politely declines STI testing today. Briefly discussed Yeztugo, but patient is not interested at this time and will reach out in the future if interest changes. Of note, Laurie Rubio requests a prescription for doxyPEP, however, patient informed that it has not  been evaluated to be beneficial in females (assigned at birth).    Routine labs:  HIV RNA today  Eligible vaccinations: Covid, HPV 3/3 (due around 10/06/2024), Havrix 2/2 (due around 12/14/2024) - Patient declines COVID vaccine today   Apretude : Administered cabotegravir  600mg /84mL in right upper outer quadrant of the gluteal muscle. Will see Laurie Rubio back in 2 months for next Apretude  injection, labs, and HIV PrEP follow up.  Plan: - Apretude  injection administered - HIV RNA today - Next injection, labs, and PrEP follow up appointment scheduled for 10/14/2024 and 12/15/2024 with Alan - Patient eligible for final HPV vaccine during January office visit  - Call with any issues or questions  Feliciano Close, PharmD PGY2  Infectious Diseases Pharmacy Resident

## 2024-08-26 LAB — HIV-1 RNA QUANT-NO REFLEX-BLD
HIV 1 RNA Quant: NOT DETECTED {copies}/mL
HIV-1 RNA Quant, Log: NOT DETECTED {Log_copies}/mL

## 2024-10-06 ENCOUNTER — Other Ambulatory Visit (HOSPITAL_COMMUNITY): Payer: Self-pay

## 2024-10-06 ENCOUNTER — Other Ambulatory Visit: Payer: Self-pay

## 2024-10-06 NOTE — Progress Notes (Signed)
 Specialty Pharmacy Refill Coordination Note  Laurie Rubio is a 44 y.o. female assessed today regarding refills of clinic administered specialty medication(s) Cabotegravir  (Apretude )   Clinic requested Courier to Provider Office   Delivery date: 10/11/24   Verified address: [address] [place] [address]   Medication will be filled on 10/08/24.

## 2024-10-07 ENCOUNTER — Other Ambulatory Visit (HOSPITAL_COMMUNITY): Payer: Self-pay

## 2024-10-07 ENCOUNTER — Telehealth: Payer: Self-pay

## 2024-10-07 NOTE — Telephone Encounter (Signed)
 Pharmacy Patient Advocate Encounter  Insurance verification completed.   The patient is insured through [employer]   Ran test claim for Yeztugo Tablets and Injection will have to be filled with [employer] [phone].   This test claim was processed through [facility]- copay amounts may vary at other pharmacies due to pharmacy/plan contracts, or as the patient moves through the different stages of their insurance plan.

## 2024-10-08 ENCOUNTER — Other Ambulatory Visit: Payer: Self-pay

## 2024-10-11 ENCOUNTER — Telehealth: Payer: Self-pay

## 2024-10-11 NOTE — Telephone Encounter (Signed)
 [facility] Patient Advocate Encounter  Patient's medications Apretude  have been couriered to [facility] from [facility] and will be administered at the patients appointment on 10/14/24.  [name] Hutchinson, CPhT Specialty Pharmacy Patient [facility] for Infectious Disease Phone: [phone] Fax:  [phone]

## 2024-10-14 ENCOUNTER — Other Ambulatory Visit (HOSPITAL_COMMUNITY): Payer: Self-pay

## 2024-10-14 ENCOUNTER — Ambulatory Visit: Payer: Self-pay | Admitting: Pharmacist

## 2024-10-14 ENCOUNTER — Other Ambulatory Visit: Payer: Self-pay

## 2024-10-14 DIAGNOSIS — Z23 Encounter for immunization: Secondary | ICD-10-CM | POA: Diagnosis not present

## 2024-10-14 DIAGNOSIS — Z113 Encounter for screening for infections with a predominantly sexual mode of transmission: Secondary | ICD-10-CM

## 2024-10-14 DIAGNOSIS — Z2981 Encounter for HIV pre-exposure prophylaxis: Secondary | ICD-10-CM

## 2024-10-14 MED ORDER — CABOTEGRAVIR ER 600 MG/3ML IM SUER
600.0000 mg | Freq: Once | INTRAMUSCULAR | Status: AC
  Administered 2024-10-14: 600 mg via INTRAMUSCULAR

## 2024-10-14 NOTE — Progress Notes (Signed)
 SABRA

## 2024-10-14 NOTE — Progress Notes (Signed)
" ° °  HPI: Laurie Rubio is a 44 y.o. female who presents to the [facility] pharmacy clinic for Apretude  administration and HIV PrEP follow up.  Referring ID Provider: Dr. 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   CABOTEGRAVIR  ER (APRETUDE ) 600 MG/3ML INJECTION    Inject 3 mLs (600 mg total) into the muscle every 2 (two) months.   FENOFIBRATE  (TRICOR ) 145 MG TABLET    Take 1 tablet (145 mg total) by mouth daily.   ICOSAPENT ETHYL (VASCEPA) 1 G CAPSULE    Take 2 g by mouth 2 (two) times daily.   LISINOPRIL  (ZESTRIL ) 5 MG TABLET    Take 5 mg by mouth daily.   METFORMIN (GLUCOPHAGE) 500 MG TABLET    Take 1,000 mg by mouth 2 (two) times daily with a meal.   Modified Medications   No medications on file  Discontinued Medications   No medications on file    Allergies: Allergies[1]  Labs: Lab Results  Component Value Date   HIV1RNAQUANT NOT DETECTED 08/24/2024   HIV1RNAQUANT NOT DETECTED 06/16/2024   HIV1RNAQUANT NOT DETECTED 05/20/2024    RPR and STI Lab Results  Component Value Date   LABRPR NON-REACTIVE 05/06/2024   LABRPR NON REACTIVE 04/20/2010    STI Results GC CT  05/06/2024  3:45 PM Negative    Negative    Negative  Negative    Negative    Negative     Hepatitis B Lab Results  Component Value Date   HEPBSAB REACTIVE (A) 05/06/2024   HEPBSAG NON-REACTIVE 05/06/2024   HEPBCAB NON-REACTIVE 05/06/2024   Hepatitis C Lab Results  Component Value Date    HEPCAB NON-REACTIVE 05/06/2024   Hepatitis A Lab Results  Component Value Date   HAV NON-REACTIVE 05/06/2024   Lipids: Lab Results  Component Value Date   CHOL 186 06/07/2023   TRIG 260 (H) 06/07/2023   HDL 30 (L) 06/07/2023   CHOLHDL 6.2 06/07/2023   VLDL 52 (H) 06/07/2023   LDLCALC 104 (H) 06/07/2023    Target Date: 28th  Assessment: Laurie Rubio presents today for her Apretude  injection and to follow up for HIV PrEP. No issues with past injections. Denies any symptoms of acute HIV. Last HIV RNA was negative on 12/2.   Routine labs:  HIV RNA today Offered STI testing; politely declines  Eligible vaccinations:  Offered HPV vaccine 3/3; Laurie Rubio accepts this vaccine. Administered into right deltoid  Apretude : Administered cabotegravir  600mg /48mL in left upper outer quadrant of the gluteal muscle. Will see Laurie Rubio back in 2 months for next Apretude  injection, labs, and HIV PrEP follow up.  Plan: - Apretude  injection administered - HPV vaccine 3/3 administered - HIV RNA today - Next injection, labs, and PrEP follow up appointment scheduled for 3/25 - Call with any issues or questions  Maurilio Patten, PharmD PGY1 Pharmacy Resident [facility] 10/14/2024     [1] No Known Allergies  "

## 2024-10-16 LAB — HIV-1 RNA QUANT-NO REFLEX-BLD
HIV 1 RNA Quant: NOT DETECTED {copies}/mL
HIV-1 RNA Quant, Log: NOT DETECTED {Log_copies}/mL

## 2024-12-15 ENCOUNTER — Ambulatory Visit: Admitting: Pharmacist

## 2025-02-10 ENCOUNTER — Ambulatory Visit: Payer: Self-pay | Admitting: Pharmacist
